# Patient Record
Sex: Male | Born: 1985 | Hispanic: Yes | Marital: Married | Smoking: Current every day smoker
Health system: Southern US, Community
[De-identification: ages and names within clinical notes are randomized; demographics above are authoritative.]

## PROBLEM LIST (undated history)

## (undated) DIAGNOSIS — F419 Anxiety disorder, unspecified: Secondary | ICD-10-CM

## (undated) DIAGNOSIS — E785 Hyperlipidemia, unspecified: Secondary | ICD-10-CM

## (undated) DIAGNOSIS — F32A Depression, unspecified: Secondary | ICD-10-CM

## (undated) DIAGNOSIS — Z8659 Personal history of other mental and behavioral disorders: Secondary | ICD-10-CM

## (undated) DIAGNOSIS — IMO0002 Reserved for concepts with insufficient information to code with codable children: Secondary | ICD-10-CM

## (undated) DIAGNOSIS — F1020 Alcohol dependence, uncomplicated: Secondary | ICD-10-CM

## (undated) DIAGNOSIS — F1910 Other psychoactive substance abuse, uncomplicated: Secondary | ICD-10-CM

## (undated) DIAGNOSIS — I10 Essential (primary) hypertension: Secondary | ICD-10-CM

## (undated) DIAGNOSIS — G894 Chronic pain syndrome: Secondary | ICD-10-CM

## (undated) DIAGNOSIS — F329 Major depressive disorder, single episode, unspecified: Secondary | ICD-10-CM

## (undated) HISTORY — DX: Alcohol dependence, uncomplicated: F10.20

## (undated) HISTORY — DX: Reserved for concepts with insufficient information to code with codable children: IMO0002

## (undated) HISTORY — PX: NECK SURGERY: SHX720

## (undated) HISTORY — DX: Major depressive disorder, single episode, unspecified: F32.9

## (undated) HISTORY — DX: Anxiety disorder, unspecified: F41.9

## (undated) HISTORY — PX: OTHER SURGICAL HISTORY: SHX169

## (undated) HISTORY — DX: Other psychoactive substance abuse, uncomplicated: F19.10

## (undated) HISTORY — DX: Chronic pain syndrome: G89.4

## (undated) HISTORY — DX: Personal history of other mental and behavioral disorders: Z86.59

## (undated) HISTORY — DX: Depression, unspecified: F32.A

## (undated) HISTORY — DX: Hyperlipidemia, unspecified: E78.5

---

## 2008-01-07 ENCOUNTER — Ambulatory Visit: Payer: Self-pay | Admitting: Occupational Medicine

## 2008-11-17 ENCOUNTER — Ambulatory Visit: Payer: Self-pay | Admitting: Family Medicine

## 2008-11-17 DIAGNOSIS — M545 Low back pain, unspecified: Secondary | ICD-10-CM | POA: Insufficient documentation

## 2008-11-17 DIAGNOSIS — F329 Major depressive disorder, single episode, unspecified: Secondary | ICD-10-CM | POA: Insufficient documentation

## 2008-11-27 ENCOUNTER — Encounter: Payer: Self-pay | Admitting: Family Medicine

## 2008-12-01 ENCOUNTER — Encounter: Payer: Self-pay | Admitting: Family Medicine

## 2008-12-06 ENCOUNTER — Encounter: Payer: Self-pay | Admitting: Family Medicine

## 2008-12-09 ENCOUNTER — Ambulatory Visit: Payer: Self-pay | Admitting: Family Medicine

## 2008-12-09 DIAGNOSIS — S92909A Unspecified fracture of unspecified foot, initial encounter for closed fracture: Secondary | ICD-10-CM | POA: Insufficient documentation

## 2008-12-15 ENCOUNTER — Ambulatory Visit: Payer: Self-pay | Admitting: Family Medicine

## 2008-12-15 DIAGNOSIS — R209 Unspecified disturbances of skin sensation: Secondary | ICD-10-CM | POA: Insufficient documentation

## 2008-12-16 ENCOUNTER — Telehealth: Payer: Self-pay | Admitting: Family Medicine

## 2008-12-16 ENCOUNTER — Encounter: Payer: Self-pay | Admitting: Family Medicine

## 2008-12-17 ENCOUNTER — Telehealth: Payer: Self-pay | Admitting: Family Medicine

## 2008-12-18 ENCOUNTER — Encounter: Payer: Self-pay | Admitting: Family Medicine

## 2008-12-23 ENCOUNTER — Encounter: Payer: Self-pay | Admitting: Family Medicine

## 2008-12-24 ENCOUNTER — Telehealth (INDEPENDENT_AMBULATORY_CARE_PROVIDER_SITE_OTHER): Payer: Self-pay | Admitting: *Deleted

## 2008-12-25 ENCOUNTER — Encounter: Payer: Self-pay | Admitting: Family Medicine

## 2009-01-05 ENCOUNTER — Ambulatory Visit: Payer: Self-pay | Admitting: Family Medicine

## 2009-01-06 ENCOUNTER — Ambulatory Visit: Payer: Self-pay | Admitting: Family Medicine

## 2009-01-06 DIAGNOSIS — R300 Dysuria: Secondary | ICD-10-CM | POA: Insufficient documentation

## 2009-01-06 LAB — CONVERTED CEMR LAB
Bilirubin Urine: NEGATIVE
Blood in Urine, dipstick: NEGATIVE
Glucose, Urine, Semiquant: NEGATIVE
Ketones, urine, test strip: NEGATIVE
Nitrite: NEGATIVE
Protein, U semiquant: NEGATIVE
Specific Gravity, Urine: 1.02
Urobilinogen, UA: 0.2
WBC Urine, dipstick: NEGATIVE
pH: 7.5

## 2009-01-07 LAB — CONVERTED CEMR LAB
ALT: 12 units/L (ref 0–53)
AST: 12 units/L (ref 0–37)
Albumin: 4.2 g/dL (ref 3.5–5.2)
Alkaline Phosphatase: 53 units/L (ref 39–117)
BUN: 11 mg/dL (ref 6–23)
CO2: 25 meq/L (ref 19–32)
Calcium: 9.1 mg/dL (ref 8.4–10.5)
Chloride: 104 meq/L (ref 96–112)
Creatinine, Ser: 0.85 mg/dL (ref 0.40–1.50)
Glucose, Bld: 89 mg/dL (ref 70–99)
Potassium: 4.3 meq/L (ref 3.5–5.3)
Sodium: 141 meq/L (ref 135–145)
Total Bilirubin: 0.4 mg/dL (ref 0.3–1.2)
Total Protein: 6.9 g/dL (ref 6.0–8.3)

## 2009-01-14 ENCOUNTER — Encounter: Payer: Self-pay | Admitting: Family Medicine

## 2009-01-14 DIAGNOSIS — M5137 Other intervertebral disc degeneration, lumbosacral region: Secondary | ICD-10-CM | POA: Insufficient documentation

## 2009-01-14 DIAGNOSIS — M5136 Other intervertebral disc degeneration, lumbar region: Secondary | ICD-10-CM | POA: Insufficient documentation

## 2009-01-14 DIAGNOSIS — M5134 Other intervertebral disc degeneration, thoracic region: Secondary | ICD-10-CM | POA: Insufficient documentation

## 2009-01-14 DIAGNOSIS — IMO0002 Reserved for concepts with insufficient information to code with codable children: Secondary | ICD-10-CM | POA: Insufficient documentation

## 2009-01-15 ENCOUNTER — Telehealth: Payer: Self-pay | Admitting: Family Medicine

## 2009-01-26 ENCOUNTER — Ambulatory Visit: Payer: Self-pay | Admitting: Occupational Medicine

## 2009-01-26 DIAGNOSIS — T22019A Burn of unspecified degree of unspecified forearm, initial encounter: Secondary | ICD-10-CM | POA: Insufficient documentation

## 2009-01-27 ENCOUNTER — Ambulatory Visit: Payer: Self-pay | Admitting: Family Medicine

## 2009-02-10 ENCOUNTER — Ambulatory Visit: Payer: Self-pay | Admitting: Family Medicine

## 2009-02-10 DIAGNOSIS — R030 Elevated blood-pressure reading, without diagnosis of hypertension: Secondary | ICD-10-CM | POA: Insufficient documentation

## 2009-02-11 ENCOUNTER — Telehealth: Payer: Self-pay | Admitting: Family Medicine

## 2009-02-12 ENCOUNTER — Telehealth: Payer: Self-pay | Admitting: Family Medicine

## 2009-03-02 ENCOUNTER — Telehealth: Payer: Self-pay | Admitting: Family Medicine

## 2009-03-12 ENCOUNTER — Ambulatory Visit: Payer: Self-pay | Admitting: Family Medicine

## 2009-03-12 DIAGNOSIS — G43119 Migraine with aura, intractable, without status migrainosus: Secondary | ICD-10-CM | POA: Insufficient documentation

## 2009-03-12 DIAGNOSIS — R112 Nausea with vomiting, unspecified: Secondary | ICD-10-CM | POA: Insufficient documentation

## 2009-03-17 ENCOUNTER — Ambulatory Visit: Payer: Self-pay | Admitting: Family Medicine

## 2009-03-18 ENCOUNTER — Encounter: Payer: Self-pay | Admitting: Family Medicine

## 2009-04-07 ENCOUNTER — Encounter: Payer: Self-pay | Admitting: Family Medicine

## 2009-04-09 ENCOUNTER — Encounter: Payer: Self-pay | Admitting: Family Medicine

## 2009-04-17 ENCOUNTER — Ambulatory Visit: Payer: Self-pay | Admitting: Family Medicine

## 2009-04-21 ENCOUNTER — Ambulatory Visit: Payer: Self-pay | Admitting: Family Medicine

## 2009-05-06 ENCOUNTER — Ambulatory Visit: Payer: Self-pay | Admitting: Family Medicine

## 2009-05-07 ENCOUNTER — Telehealth (INDEPENDENT_AMBULATORY_CARE_PROVIDER_SITE_OTHER): Payer: Self-pay | Admitting: *Deleted

## 2009-06-03 ENCOUNTER — Ambulatory Visit: Payer: Self-pay | Admitting: Family Medicine

## 2009-06-22 ENCOUNTER — Telehealth: Payer: Self-pay | Admitting: Family Medicine

## 2009-06-29 ENCOUNTER — Ambulatory Visit: Payer: Self-pay | Admitting: Family Medicine

## 2009-06-29 DIAGNOSIS — I498 Other specified cardiac arrhythmias: Secondary | ICD-10-CM | POA: Insufficient documentation

## 2009-07-02 LAB — CONVERTED CEMR LAB
Amphetamine Screen, Ur: NEGATIVE
Barbiturate Quant, Ur: NEGATIVE
Benzodiazepines.: NEGATIVE
Cocaine Metabolites: NEGATIVE
Creatinine,U: 207.9 mg/dL
Ethyl Alcohol: 75 mg/dL — ABNORMAL HIGH (ref <10)
Marijuana Metabolite: NEGATIVE
Methadone: NEGATIVE
Opiate Screen, Urine: NEGATIVE
Phencyclidine (PCP): NEGATIVE
Propoxyphene: NEGATIVE

## 2009-07-13 ENCOUNTER — Ambulatory Visit: Payer: Self-pay | Admitting: Family Medicine

## 2009-07-16 ENCOUNTER — Telehealth (INDEPENDENT_AMBULATORY_CARE_PROVIDER_SITE_OTHER): Payer: Self-pay | Admitting: *Deleted

## 2009-07-22 ENCOUNTER — Encounter: Payer: Self-pay | Admitting: Family Medicine

## 2009-07-27 ENCOUNTER — Ambulatory Visit: Payer: Self-pay | Admitting: Family Medicine

## 2009-07-27 DIAGNOSIS — R55 Syncope and collapse: Secondary | ICD-10-CM | POA: Insufficient documentation

## 2009-08-03 ENCOUNTER — Telehealth: Payer: Self-pay | Admitting: Family Medicine

## 2009-08-04 ENCOUNTER — Ambulatory Visit: Payer: Self-pay | Admitting: Family Medicine

## 2009-08-04 DIAGNOSIS — F10939 Alcohol use, unspecified with withdrawal, unspecified: Secondary | ICD-10-CM | POA: Insufficient documentation

## 2009-08-04 DIAGNOSIS — F10239 Alcohol dependence with withdrawal, unspecified: Secondary | ICD-10-CM | POA: Insufficient documentation

## 2009-08-11 ENCOUNTER — Telehealth: Payer: Self-pay | Admitting: Family Medicine

## 2009-08-11 ENCOUNTER — Encounter: Payer: Self-pay | Admitting: Family Medicine

## 2009-08-11 ENCOUNTER — Telehealth (INDEPENDENT_AMBULATORY_CARE_PROVIDER_SITE_OTHER): Payer: Self-pay | Admitting: *Deleted

## 2009-08-21 ENCOUNTER — Encounter: Payer: Self-pay | Admitting: Family Medicine

## 2009-08-25 ENCOUNTER — Ambulatory Visit: Payer: Self-pay | Admitting: Family Medicine

## 2009-08-25 DIAGNOSIS — F1021 Alcohol dependence, in remission: Secondary | ICD-10-CM | POA: Insufficient documentation

## 2009-08-25 DIAGNOSIS — F1921 Other psychoactive substance dependence, in remission: Secondary | ICD-10-CM | POA: Insufficient documentation

## 2009-08-28 ENCOUNTER — Encounter: Payer: Self-pay | Admitting: Family Medicine

## 2009-08-28 ENCOUNTER — Telehealth: Payer: Self-pay | Admitting: Family Medicine

## 2009-09-01 ENCOUNTER — Encounter: Payer: Self-pay | Admitting: Family Medicine

## 2009-09-02 ENCOUNTER — Encounter: Payer: Self-pay | Admitting: Family Medicine

## 2009-10-07 ENCOUNTER — Encounter: Payer: Self-pay | Admitting: Family Medicine

## 2009-10-09 ENCOUNTER — Ambulatory Visit: Payer: Self-pay | Admitting: Family Medicine

## 2009-10-14 ENCOUNTER — Ambulatory Visit: Payer: Self-pay | Admitting: Family Medicine

## 2010-05-04 NOTE — Progress Notes (Signed)
----   Converted from flag ---- ---- 05/06/2009 8:41 PM, [name] DO wrote: pls fax last OV note to Dr Jordan Likes, thanks ------------------------------  Faxed note to Dr. Jordan Likes. KJ LPN

## 2010-05-04 NOTE — Progress Notes (Signed)
Summary: Call A Nurse  Phone Note From Other Clinic   Summary of Call: [facility] Triage Call Report Triage Record Num: [id number] Operator: Sula Rumple Patient Name: Cole [name] Call Date & Time: 08/10/2009 9:52:39PM Patient Phone: [phone] PCP: [name], DO Patient Gender: Male PCP Fax : [phone] Patient DOB: 1986-01-14 Practice Name: [facility] Reason for Call: [name]/Wife calling on 08/09/09 states pt is on Ativan one week ago/pt was in hospital for app one week/d/c on 08/04/09/ pt has had increasing confusion/increased sleepiness/afebrile/pt fell several times last week and hit his head. Advised to go to ER Protocol(s) Used: Confusion / Disorientation / Agitation Recommended Outcome per Protocol: See ED Immediately Reason for Outcome: New or worsening confusion, disorientation, or agitation related to a possible reversible cause (infection, dehydration, fever, etc.) Care Advice:  ~  Initial call taken by: Payton Spark CMA,  Aug 11, 2009 8:10 AM     Appended Document: Call A Nurse Marcelino Duster, Pls call and see if pt is back in the hospital and let's see if we can expedite an outpt neurology appt for him.  [name], D.O.  Appended Document: Call A Nurse [facility] for Pt to CB.Arvilla Market CMA, Michelle Aug 11, 2009 11:36 AM   Pt is in hosp. Wife spoke w/ Dr. Cathey Endow.Arvilla Market CMA, Michelle Aug 11, 2009 4:52 PM

## 2010-05-04 NOTE — Letter (Signed)
Summary: [facility]  [facility]   Imported By: Lanelle Bal 09/07/2009 09:27:44  _____________________________________________________________________  External Attachment:    Type:   Image     Comment:   External Document
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
## 2010-05-04 NOTE — Progress Notes (Signed)
Summary: Flexeril refill  Phone Note Refill Request   Refills Requested: Medication #1:  CYCLOBENZAPRINE HCL 10 MG TABS 1 tab by mouth at bedtime as needed back pain Initial call taken by: Payton Spark CMA,  June 22, 2009 2:42 PM    Prescriptions: CYCLOBENZAPRINE HCL 10 MG TABS (CYCLOBENZAPRINE HCL) 1 tab by mouth at bedtime as needed back pain  #30 x 2   Entered and Authorized by:   [name] DO   Signed by:   [name] DO on 06/22/2009   Method used:   Electronically to        [facility]* (retail)       [address] [place], [place]  [address]       Ph: [phone]       Fax: [phone]   RxID:   [id number]

## 2010-05-04 NOTE — Progress Notes (Signed)
Summary: Call A Nurse  Phone Note Call from Patient   Summary of Call: Call-A-Nurse Triage Call Report Triage Record Num: [id number] Operator: Albertine Grates Patient Name: Cole [name] Call Date & Time: 08/02/2009 7:10:59PM Patient Phone: PCP: [name], DO Patient Gender: Male PCP Fax : [phone] Patient DOB: 11-21-1985 [facility] Reason for Call: [name] calling and states has fainted 6 times in last 3 hours. Was seen in ED 4-29 and diagnosed with possible seizure. Was prescribed Ativan. Has lost med 5-1. Has had headache since seizures began. Seizure will last for 2 minutes. Will have tremors when occurs. Advised 911. Protocol(s) Used: Seizure Recommended Outcome per Protocol: Activate EMS 911 Reason for Outcome: Headache (severe or unusual) Care Advice:  ~ Initial call taken by: Payton Spark CMA,  Aug 03, 2009 8:11 AM  Follow-up for Phone Call        Pls call his house and find out if he went back to the hospital. Follow-up by: [name] DO,  Aug 03, 2009 8:14 AM     Appended Document: Call A Nurse Called only number listed and [facility] for a RC

## 2010-05-04 NOTE — Progress Notes (Signed)
Summary: call ASAP to discuss pt care  Phone Note Call from Patient   Caller: Spouse Summary of Call: Wife called and states that he was taken by ambulance last night and was sent to [facility] because he over dosed on his meds. Pt's wife is very upset and needs to talk to Dr. Cathey Endow to let Dr.Novella Abraha know what is going on with Cole [name]. Call [phone]- wife's name is Cole [name], and would like for you to call her as soon as possible. Thanks, Michaelle Copas Initial call taken by: Michaelle Copas,  Aug 11, 2009 2:55 PM  Follow-up for Phone Call        I called her back and LMOM to call me back to discuss. Follow-up by: [name] DO,  Aug 11, 2009 3:36 PM  Additional Follow-up for Phone Call Additional follow up Details #1::        I spoke w/ Cole [name]' wife Hospital doctor and he is back in the hosp, at [facility].  He OD'd on ATivan last night and she reports that he has been taking too many ativan, morphine tabs and 'sucking on' his fentanyl patch.  She has yet to get him to cards or neuro b/c he continues to get readmitted.  She is still not sure if he is having true seizures.  She would like him admitted to [facility] for inpt assistance and I am in agreement with this plan. Additional Follow-up by: [name] DO,  Aug 11, 2009 5:02 PM
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
## 2010-05-04 NOTE — Letter (Signed)
Summary: [facility]  [facility]   Imported By: Lanelle Bal 07/28/2009 14:13:24  _____________________________________________________________________  External Attachment:    Type:   Image     Comment:   External Document
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
## 2010-05-04 NOTE — Letter (Signed)
Summary: Meds & Instructions/[facility]  Meds & Instructions/[facility]   Imported By: Lanelle Bal 08/19/2009 14:11:12  _____________________________________________________________________  External Attachment:    Type:   Image     Comment:   External Document

## 2010-05-04 NOTE — Progress Notes (Signed)
Summary: MRI brain -- normal  Phone Note Outgoing Call   Summary of Call: Kim- Pls call pt and let him know that his MRI of the brain came back completely normal.  Fax copy to [facility] for upcoming appt.  Thanks! Initial call taken by: [name] DO,  Aug 28, 2009 2:26 PM  Follow-up for Phone Call        pt notified of results. Copy faxed to Neurology Follow-up by: Kathlene November,  Aug 28, 2009 2:29 PM

## 2010-05-04 NOTE — Letter (Signed)
Summary: [facility]  [facility]   Imported By: Lanelle Bal 04/28/2009 10:59:35  _____________________________________________________________________  External Attachment:    Type:   Image     Comment:   External Document
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
## 2010-05-04 NOTE — Consult Note (Signed)
Summary: [facility] @ [facility] @ [place]   Imported By: Lanelle Bal 09/08/2009 11:34:58  _____________________________________________________________________  External Attachment:    Type:   Image     Comment:   External Document

## 2010-05-04 NOTE — Letter (Signed)
Summary: Depression Questionnaire/[name]  Depression Questionnaire/[place] [name]   Imported By: Lanelle Bal 06/09/2009 13:32:33  _____________________________________________________________________  External Attachment:    Type:   Image     Comment:   External Document

## 2010-05-04 NOTE — Letter (Signed)
Summary: [facility]  [facility]   Imported By: Lanelle Bal 10/20/2009 13:48:31  _____________________________________________________________________  External Attachment:    Type:   Image     Comment:   External Document
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
## 2010-05-04 NOTE — Progress Notes (Signed)
Summary: Call A Nurse  Phone Note From Other Clinic   Caller: Call A Nurse Summary of Call: [facility] Triage Call Report Triage Record Num: [id number] Operator: Remonia Richter Patient Name: Cole [name] Call Date & Time: 07/15/2009 9:29:45PM Patient Phone: [phone] PCP: [name], DO Patient Gender: Male PCP Fax : [phone] Patient DOB: 04/01/1986 Practice Name: [facility] Reason for Call: Ginger at [facility] [name] :calling to get Zofran script on this patient who says it was supposed to be called in by MD, Patient called and seen in office 2 days ago for nausea and told script not called in for Zofran as told to him by office for flu like s/s, Zofran 4mg  PO q 8 hrs as needed,#6,NR called in, advised to contact office for further refill. Protocol(s) Used: Medication Question Calls, No Triage (Adults) Recommended Outcome per Protocol: Provide Information or Advice Only Reason for Outcome: Caller requesting a refill, no triage required and triager able to refill per unit policy Care Advice:  ~ 04/13/ Initial call taken by: Payton Spark CMA,  July 16, 2009 8:19 AM  Follow-up for Phone Call        Pls call pt back today and let him know that I got his message but I did no  just see him in the office for nausea.  Not sure what office he went to but the info that he gave call a nurse was inaccurate.   Follow-up by: [name] DO,  July 16, 2009 8:23 AM  Additional Follow-up for Phone Call Additional follow up Details #1::        LMOM for Pt to CB. Additional Follow-up by: Payton Spark CMA,  July 16, 2009 8:29 AM

## 2010-05-04 NOTE — Letter (Signed)
Summary: CONSENT FOR MEDICAL RECORDS  CONSENT FOR MEDICAL RECORDS   Imported By: Shelbie Proctor 09/02/2009 18:26:56  _____________________________________________________________________  External Attachment:    Type:   Image     Comment:   External Document

## 2010-05-04 NOTE — Letter (Signed)
Summary: [facility]  [facility]   Imported By: Lanelle Bal 07/30/2009 10:50:16  _____________________________________________________________________  External Attachment:    Type:   Image     Comment:   External Document

## 2010-05-04 NOTE — Letter (Signed)
Summary: [facility]  [facility]   Imported By: Lanelle Bal 04/16/2009 12:03:31  _____________________________________________________________________  External Attachment:    Type:   Image     Comment:   External Document
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
## 2010-05-04 NOTE — Letter (Signed)
Summary: Depression Questionnaire/[place] Kath[facility]ne Sharper  Depression Questionnaire/[place] [facility] Sharper   Imported By: Lanelle Bal 05/12/2009 11:12:12  _____________________________________________________________________  External Attachment:    Type:   Image     Comment:   External Document

## 2010-06-14 ENCOUNTER — Ambulatory Visit (INDEPENDENT_AMBULATORY_CARE_PROVIDER_SITE_OTHER): Admitting: Psychology

## 2010-06-14 DIAGNOSIS — F4325 Adjustment disorder with mixed disturbance of emotions and conduct: Secondary | ICD-10-CM

## 2010-06-25 ENCOUNTER — Encounter (INDEPENDENT_AMBULATORY_CARE_PROVIDER_SITE_OTHER): Admitting: Psychology

## 2010-06-25 DIAGNOSIS — F4325 Adjustment disorder with mixed disturbance of emotions and conduct: Secondary | ICD-10-CM

## 2010-07-12 ENCOUNTER — Encounter (HOSPITAL_COMMUNITY): Admitting: Psychology

## 2010-07-13 ENCOUNTER — Encounter (HOSPITAL_COMMUNITY): Admitting: Psychology

## 2010-07-19 ENCOUNTER — Encounter (HOSPITAL_COMMUNITY): Admitting: Psychology

## 2011-07-14 ENCOUNTER — Encounter: Admitting: Physician Assistant

## 2011-07-15 ENCOUNTER — Encounter: Payer: Self-pay | Admitting: *Deleted

## 2011-07-18 ENCOUNTER — Ambulatory Visit (INDEPENDENT_AMBULATORY_CARE_PROVIDER_SITE_OTHER): Admitting: Physician Assistant

## 2011-07-18 ENCOUNTER — Encounter: Payer: Self-pay | Admitting: Physician Assistant

## 2011-07-18 VITALS — BP 150/92 | HR 88 | Ht 73.0 in | Wt 232.0 lb

## 2011-07-18 DIAGNOSIS — Z13220 Encounter for screening for lipoid disorders: Secondary | ICD-10-CM

## 2011-07-18 DIAGNOSIS — IMO0001 Reserved for inherently not codable concepts without codable children: Secondary | ICD-10-CM

## 2011-07-18 DIAGNOSIS — Z889 Allergy status to unspecified drugs, medicaments and biological substances status: Secondary | ICD-10-CM

## 2011-07-18 DIAGNOSIS — M545 Low back pain, unspecified: Secondary | ICD-10-CM

## 2011-07-18 DIAGNOSIS — Z0000 Encounter for general adult medical examination without abnormal findings: Secondary | ICD-10-CM

## 2011-07-18 NOTE — Progress Notes (Signed)
Subjective:    Patient ID: Cole [name], male    DOB: [birth year], 26 y.o.   MRN: [id number]  HPI Patient presents to the clinic for CPE. Patient has a history of low back pain since he was injured in the [employer]. He is taking bottles of ibuprofen and tylenol monthly. He reports that at one point he was addicted to pain medication(opoids). He really wants some additional help with pain management so he doesn't have to take so many OTC pills.   He does not have a history of high bp but patients bp is elevated today. He has been adding a lot of salt to his foods and his wife cooks with a lot of salt. He drinks approximately 12 beers a week. He reports that he doesn't eat out very much. He does exercise 3-4 days a week with cardio.  He also has been having episode in which he breaks out in a rash on the upper part of his body. He has not changed his detergent and he can not think of any common denominator. He does not do anything and it just goes away. He denies any SOB, wheezing, or feeling like his throat is going to close up.It can happen when he is just sitting there or when he is up and about.    Review of Systems     Objective:   Physical Exam  Constitutional: He is oriented to person, place, and time. He appears well-developed and well-nourished.  HENT:  Head: Normocephalic and atraumatic.  Right Ear: External ear normal.  Left Ear: External ear normal.  Nose: Nose normal.  Mouth/Throat: Oropharynx is clear and moist. No oropharyngeal exudate.  Eyes: Conjunctivae and EOM are normal. Pupils are equal, round, and reactive to light.  Neck: Normal range of motion. Neck supple.  Cardiovascular: Normal rate, regular rhythm, normal heart sounds and intact distal pulses.   Pulmonary/Chest: Effort normal and breath sounds normal. He has no wheezes.  Abdominal: Soft. Bowel sounds are normal. He exhibits no distension and no mass. There is no tenderness. There is no rebound and no guarding.    Genitourinary:       Not done.  Musculoskeletal: Normal range of motion.       Mild tenderness over the thoraciclumbar spine with palpation. Strength 5/5.  Lymphadenopathy:    He has no cervical adenopathy.  Neurological: He is alert and oriented to person, place, and time. He has normal reflexes. No cranial nerve deficit.  Skin: Skin is warm and dry.  Psychiatric: He has a normal mood and affect. His behavior is normal.          Assessment & Plan:  CPE- Gave lab form to get blood work after fasting for 8 hours. Will call with results. Vaccines are up to date. Patient was instructed to continue to exercise 4-5 days a week. Discussed significant weight gain. Talked about making sure patient was maintaing a heathy diet and limited bad carbs. Encouraged patient to decrease beer intake along with fried foods and red meat. Encouraged patient to take multivitamin daily.  Low back pain, chronic- Patient wants to be referred to pain management that will not use opioids with him only epidural injections/PT or other pain control methods. Currently consuming too many OTC tylenol and Ibuprofen.  Elevated blood pressure- Discussed that we need to recheck blood pressure in 1 month with a nurse visit. Patient needs to decrease sodium intake not adding any salt to foods, decreasing the amount of processed food  you consume, limiting eating out, and decrease beer intake to no more than 2 a day. Reminded patient that weight loss can also help bring bp down also.  History of allergic reaction- Keep a diary of when rash appears and the things you are doing when they happen. Always keep benadryl handy to take when rash occurs. If your throat ever starts to swell or breathing becomes a problem take benadryl 50mg  and call office.

## 2011-07-18 NOTE — Patient Instructions (Addendum)
Continue exercise regularly. Recheck one year. If still having allergic reactions and want to be tested can call office for referral. Will refer to pain management for back pain for non-opoid treatment. Come in for nurse visit in 1 month to recheck blood pressure. In the meantime, decrease sodium intake.

## 2011-08-25 ENCOUNTER — Telehealth: Payer: Self-pay | Admitting: *Deleted

## 2011-08-25 DIAGNOSIS — M545 Low back pain: Secondary | ICD-10-CM

## 2011-08-25 DIAGNOSIS — G8929 Other chronic pain: Secondary | ICD-10-CM

## 2011-08-25 NOTE — Telephone Encounter (Signed)
Pt is requesting referral to [facility] in [place].

## 2011-08-26 NOTE — Telephone Encounter (Signed)
treid to call and inform pt but states number is invalid

## 2011-08-26 NOTE — Telephone Encounter (Signed)
Call and let know sent referral. If not called in a week please call office to make sure everything has been scheduled.

## 2011-10-12 ENCOUNTER — Telehealth: Payer: Self-pay | Admitting: *Deleted

## 2011-10-12 MED ORDER — PERMETHRIN 5 % EX CREA: TOPICAL_CREAM | Freq: Once | CUTANEOUS | Status: AC

## 2011-10-12 NOTE — Telephone Encounter (Signed)
Sent to pharmacy 

## 2011-10-12 NOTE — Telephone Encounter (Signed)
Pt states his kids had scabies 2-4 weeks ago and now he has the same thing and request something be called in.

## 2011-10-13 NOTE — Telephone Encounter (Signed)
Pt informed

## 2012-05-11 ENCOUNTER — Other Ambulatory Visit: Payer: Self-pay | Admitting: Physician Assistant

## 2012-05-11 MED ORDER — CYPROHEPTADINE HCL 4 MG PO TABS: 4.0000 mg | ORAL_TABLET | Freq: Three times a day (TID) | ORAL | Status: DC | PRN

## 2012-05-11 MED ORDER — CETIRIZINE HCL 10 MG PO TABS: 10.0000 mg | ORAL_TABLET | Freq: Every day | ORAL | Status: DC

## 2012-05-11 MED ORDER — LORATADINE 10 MG PO TABS: 10.0000 mg | ORAL_TABLET | Freq: Every day | ORAL | Status: DC

## 2012-10-10 ENCOUNTER — Ambulatory Visit (INDEPENDENT_AMBULATORY_CARE_PROVIDER_SITE_OTHER): Admitting: Physician Assistant

## 2012-10-10 ENCOUNTER — Encounter: Payer: Self-pay | Admitting: Physician Assistant

## 2012-10-10 VITALS — BP 144/94 | HR 90 | Wt 240.0 lb

## 2012-10-10 DIAGNOSIS — F909 Attention-deficit hyperactivity disorder, unspecified type: Secondary | ICD-10-CM

## 2012-10-10 MED ORDER — AMPHETAMINE-DEXTROAMPHET ER 20 MG PO CP24: 20.0000 mg | ORAL_CAPSULE | ORAL | Status: DC

## 2012-10-10 NOTE — Progress Notes (Signed)
  Subjective:    Patient ID: Cole [name], male    DOB: 04/07/1985, 27 y.o.   MRN: [id number]  HPI Patient presents to the clinic to address lack of focus and ADHD symptoms. He had been diagnosed as a child but stopped medication thinking he could control on his on. He comes in today but he feels like his focus is worsening. He is very easily distracted. He is having problems staying on task. At work he makes mistakes. 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 He starts many task but cannot seem to finish. He denies any anxiety or depression. He is effected most at work. As a child he was on concerta and ritalin. He denies any CP, palpations, SOB.    Review of Systems     Objective:   Physical Exam  Constitutional: He is oriented to person, place, and time. He appears well-developed and well-nourished.  HENT:  Head: Normocephalic and atraumatic.  Cardiovascular: Normal rate, regular rhythm and normal heart sounds.   Pulmonary/Chest: Effort normal and breath sounds normal.  Neurological: He is alert and oriented to person, place, and time.  Skin: Skin is warm and dry.  Psychiatric:  Easily distracted. Found it difficult to finish questionaire. Stop and started different train of thoughts throughout encounter.           Assessment & Plan:  ADHD- ADD screening 18/21. ADHD questionaire positive for attention problems and hyperactivity scoring above cut off. Due to HPI and history of ADHD treatment I did put on adderall daily. Discussed control nautrue of drug. SE of insomnia, heart racing, BP issues. Discussed to keep in a safe place. Will follow up in 1 month. Encouraged exercise to help with symptoms.

## 2012-10-11 DIAGNOSIS — F909 Attention-deficit hyperactivity disorder, unspecified type: Secondary | ICD-10-CM | POA: Insufficient documentation

## 2012-10-29 ENCOUNTER — Encounter: Payer: Self-pay | Admitting: Sports Medicine

## 2012-10-29 ENCOUNTER — Ambulatory Visit (INDEPENDENT_AMBULATORY_CARE_PROVIDER_SITE_OTHER): Admitting: Sports Medicine

## 2012-10-29 VITALS — BP 147/102 | HR 127 | Wt 225.0 lb

## 2012-10-29 DIAGNOSIS — M545 Low back pain, unspecified: Secondary | ICD-10-CM

## 2012-10-29 DIAGNOSIS — F1121 Opioid dependence, in remission: Secondary | ICD-10-CM | POA: Insufficient documentation

## 2012-10-29 MED ORDER — PREDNISONE 10 MG PO TABS: ORAL_TABLET | ORAL | Status: DC

## 2012-10-29 MED ORDER — KETOROLAC TROMETHAMINE 30 MG/ML IM SOLN: 30.0000 mg | Freq: Once | INTRAMUSCULAR | Status: DC

## 2012-10-29 NOTE — Assessment & Plan Note (Signed)
I would avoid narcotics in this individual.

## 2012-10-29 NOTE — Assessment & Plan Note (Addendum)
This sounds discogenic with right-sided L4 radiculitis. It sounds as though he's had multiple MRIs, I do not have access to any of them. He's already had oral steroids, physical therapy, no surgeries. He has had epidurals in the past that were effective. I do need a new MRI to further assess his anatomy as it's likely changed since his last MRI several years ago, I'd like him to followup with me for interventional injection planning. Toradol 30 mg intramuscular today. Prednisone taper.
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
## 2012-10-30 ENCOUNTER — Ambulatory Visit (HOSPITAL_BASED_OUTPATIENT_CLINIC_OR_DEPARTMENT_OTHER)

## 2012-11-07 ENCOUNTER — Telehealth: Payer: Self-pay | Admitting: *Deleted

## 2012-11-07 ENCOUNTER — Ambulatory Visit (INDEPENDENT_AMBULATORY_CARE_PROVIDER_SITE_OTHER): Admitting: Physician Assistant

## 2012-11-07 ENCOUNTER — Encounter: Payer: Self-pay | Admitting: Physician Assistant

## 2012-11-07 VITALS — BP 142/84 | HR 109 | Wt 228.0 lb

## 2012-11-07 DIAGNOSIS — M545 Low back pain, unspecified: Secondary | ICD-10-CM

## 2012-11-07 DIAGNOSIS — N5089 Other specified disorders of the male genital organs: Secondary | ICD-10-CM

## 2012-11-07 DIAGNOSIS — F909 Attention-deficit hyperactivity disorder, unspecified type: Secondary | ICD-10-CM

## 2012-11-07 DIAGNOSIS — F1921 Other psychoactive substance dependence, in remission: Secondary | ICD-10-CM

## 2012-11-07 MED ORDER — AMPHETAMINE-DEXTROAMPHET ER 20 MG PO CP24: 20.0000 mg | ORAL_CAPSULE | ORAL | Status: DC

## 2012-11-07 NOTE — Patient Instructions (Addendum)
Will order MRI for [facility].   Will call urologist and let you know what he thinks should be done.   Will refer to pain management.   Refilled Adderall will recheck in one month.

## 2012-11-07 NOTE — Telephone Encounter (Signed)
No prior auth needed for MRI L spine w/i contrast

## 2012-11-07 NOTE — Progress Notes (Signed)
  Subjective:    Patient ID: Cole [name], male    DOB: [birth year], 27 y.o.   MRN: [id number]  HPI Patient is a 27 year old male who presents to the clinic to followup on one month of Adderall for ADHD. Patient is doing well on therapy. He feels much more focused at work and able to complete tasks. He would rate his improvement about 65%. He denies any insomnia, anorexia, palpitations. His blood pressure has increased since last visit before starting Adderall. He denies any headaches, vision changes, shortness of breath. He is benefiting from therapy would like to continue.  He was seen by Dr. Bonnita Levan on 10/29/12 for ongoing low back pain. An MRI was set up for for the [facility]. He has to pay for [facility] but does not have the [facility] system he would like a referral put in for another MRI for [facility]. He is also concerned that he has been labeled with a history of narcotic abuse at this office and not been getting any pain medication. He would like a referral to pain management. He reports that he did at one time of her dose on OxyContin but was many years ago and has since used narcotics as needed for back pain. He is aware of how that looks but feels like with his chronic issues still may need narcotic therapy at some point.  Patient also has had some unusual episodes in the last week or so. He reports that every time he has orgasm his testicles raised to above his penis and tighten. He describes it is almost feels like his testicles have gone back up into his body. He denies it being painful but says it is uncomfortable. He does feel like once his testicles come down after orgasm they are tender and swollen. He has done nothing to make better. Nothing seems to make worse. He has never had anything like this before.    Review of Systems     Objective:   Physical Exam  Constitutional: He is oriented to person, place, and time. He appears well-developed and well-nourished.   HENT:  Head: Normocephalic and atraumatic.  Cardiovascular: Normal rate, regular rhythm and normal heart sounds.   Pulmonary/Chest: Effort normal and breath sounds normal.  Abdominal: Soft. Bowel sounds are normal.  Neurological: He is alert and oriented to person, place, and time.  Skin: Skin is warm and dry.  Psychiatric: He has a normal mood and affect. His behavior is normal.          Assessment & Plan:  ADHD-we'll keep patient has same dose of Adderall. Blood pressure was slightly increased but recheck today and had gone back down. We'll recheck in one month blood pressure mixture patient sold doing good at dose.  Chronic low back pain-will refer to pain management. Will order MRI for [facility].  Episode testicular swelling and raising-not exactly sure of what could be causing this. I did talk to patient about testicular torsion and how the mechanism of this happening makes that more likely. His testicles do not come down after orgasm or begin to swell please call office or go to emergency room. I will talk to other colleagues about protocol and potentially call urology to see if referral is needed.

## 2012-11-09 ENCOUNTER — Telehealth: Payer: Self-pay | Admitting: Physician Assistant

## 2012-11-09 DIAGNOSIS — N5089 Other specified disorders of the male genital organs: Secondary | ICD-10-CM

## 2012-11-09 DIAGNOSIS — N50819 Testicular pain, unspecified: Secondary | ICD-10-CM

## 2012-11-09 NOTE — Telephone Encounter (Signed)
Call pt: Let him know I consulted a Doctor in office and they were also not aware of the mechanism of action of this. I would like for you to be seen by urology. Will make referral.

## 2012-12-05 ENCOUNTER — Ambulatory Visit: Admitting: Physician Assistant

## 2012-12-07 ENCOUNTER — Ambulatory Visit (INDEPENDENT_AMBULATORY_CARE_PROVIDER_SITE_OTHER): Admitting: Physician Assistant

## 2012-12-07 ENCOUNTER — Encounter: Payer: Self-pay | Admitting: Physician Assistant

## 2012-12-07 VITALS — BP 139/85 | HR 93 | Wt 217.0 lb

## 2012-12-07 DIAGNOSIS — M545 Low back pain, unspecified: Secondary | ICD-10-CM

## 2012-12-07 DIAGNOSIS — IMO0002 Reserved for concepts with insufficient information to code with codable children: Secondary | ICD-10-CM

## 2012-12-07 DIAGNOSIS — F909 Attention-deficit hyperactivity disorder, unspecified type: Secondary | ICD-10-CM

## 2012-12-07 MED ORDER — AMPHETAMINE-DEXTROAMPHET ER 20 MG PO CP24: 20.0000 mg | ORAL_CAPSULE | ORAL | Status: DC

## 2012-12-07 NOTE — Patient Instructions (Addendum)
Faxed referral and ov notes and ins to [facility] at [facility] [phone] and their Phone is [phone]. Requested they schedule with patient for a good appt time. Thanks  Consider looking into [facility].

## 2012-12-08 NOTE — Progress Notes (Signed)
  Subjective:    Patient ID: Cole [name], male    DOB: May 21, 1985, 27 y.o.   MRN: [id number]  HPI Pt presents to the clinic to recheck BP. Pt has been on ADHD medication and want to make sure BP not elevating. Pt denies any CP, palpitations, HA's, vision changes. He feels great on current dose. Pt is losing weight by not drinking soft drinks and trying to stay more active.   He has never been called with pain management appt.    Review of Systems     Objective:   Physical Exam  Constitutional: He is oriented to person, place, and time. He appears well-developed and well-nourished.  HENT:  Head: Normocephalic and atraumatic.  Cardiovascular: Normal rate, regular rhythm and normal heart sounds.   Pulmonary/Chest: Effort normal and breath sounds normal.  Neurological: He is alert and oriented to person, place, and time.  Skin: Skin is warm and dry.  Psychiatric: He has a normal mood and affect. His behavior is normal.          Assessment & Plan:  ADHD/elevated BP- Looking back over history there has been no change in BP with addition of Adderall. Refilled Adderall today. Will keep a close monitor on BP. Pt does not want to start a medication for BP. He is losing weight and I hope that will decrease BP just a tad. Pt's BP is certanily only slightly elevated. Follow up in 3 months.   Sent another referral for pain management.

## 2013-01-07 ENCOUNTER — Other Ambulatory Visit: Payer: Self-pay | Admitting: Physician Assistant

## 2013-01-07 ENCOUNTER — Other Ambulatory Visit: Payer: Self-pay | Admitting: *Deleted

## 2013-01-07 MED ORDER — ADDERALL 20 MG PO TABS: 20.0000 mg | ORAL_TABLET | Freq: Every day | ORAL | Status: DC

## 2013-01-07 MED ORDER — AMPHETAMINE-DEXTROAMPHET ER 20 MG PO CP24: 20.0000 mg | ORAL_CAPSULE | ORAL | Status: DC

## 2013-01-07 MED ORDER — ADDERALL XR 20 MG PO CP24: 20.0000 mg | ORAL_CAPSULE | ORAL | Status: DC

## 2013-01-07 NOTE — Progress Notes (Signed)
Pt needed XR.

## 2013-02-04 ENCOUNTER — Other Ambulatory Visit: Payer: Self-pay | Admitting: *Deleted

## 2013-02-04 MED ORDER — ADDERALL XR 20 MG PO CP24: 20.0000 mg | ORAL_CAPSULE | ORAL | Status: DC

## 2013-02-06 ENCOUNTER — Encounter: Payer: Self-pay | Admitting: Physician Assistant

## 2013-02-06 ENCOUNTER — Ambulatory Visit (INDEPENDENT_AMBULATORY_CARE_PROVIDER_SITE_OTHER): Admitting: Physician Assistant

## 2013-02-06 VITALS — BP 126/80 | HR 98 | Wt 210.0 lb

## 2013-02-06 DIAGNOSIS — F909 Attention-deficit hyperactivity disorder, unspecified type: Secondary | ICD-10-CM

## 2013-02-06 MED ORDER — ADDERALL XR 25 MG PO CP24: 25.0000 mg | ORAL_CAPSULE | ORAL | Status: DC

## 2013-02-06 NOTE — Progress Notes (Signed)
  Subjective:    Patient ID: Cole [name], male    DOB: December 13, 1985, 27 y.o.   MRN: [id number]  HPI Patient presents to the clinic to followup on ADHD and Adderall. He is found that the brand only Adderall works better than the generic. He feels that the 20 mg dose it is not lasting him as far into the evening as he would like. He works a 12 hour shift. He finds that the Adderall is wearing off around the 4:30 5:00 mark. He denies any insomnia or palpitations. He does have some decreased appetite but is not losing weight and is eating 3 meals a day.   Review of Systems     Objective:   Physical Exam  Constitutional: He is oriented to person, place, and time. He appears well-developed and well-nourished.  Cardiovascular: Normal rate, regular rhythm and normal heart sounds.   Pulmonary/Chest: Effort normal and breath sounds normal.  Neurological: He is alert and oriented to person, place, and time.  Skin: Skin is warm and dry.  Psychiatric: He has a normal mood and affect. His behavior is normal.          Assessment & Plan:  ADHD-increased extended-release Adderall to 25 mg. What may now within the month if this is controlling symptoms. Once we get an appropriate dose and then start following up less. Followup in 3 months.

## 2013-03-07 ENCOUNTER — Telehealth: Payer: Self-pay | Admitting: *Deleted

## 2013-03-07 NOTE — Telephone Encounter (Signed)
Pt states that you mentioned he could get his adderall increased to 30mg . Please advise and if so we can call him to pick it up on Friday.  Meyer Cory, LPN

## 2013-03-08 ENCOUNTER — Other Ambulatory Visit: Payer: Self-pay | Admitting: Physician Assistant

## 2013-03-08 ENCOUNTER — Ambulatory Visit: Admitting: Physician Assistant

## 2013-03-08 MED ORDER — ADDERALL XR 30 MG PO CP24: 30.0000 mg | ORAL_CAPSULE | Freq: Every day | ORAL | Status: DC

## 2013-03-08 NOTE — Telephone Encounter (Signed)
Will increase extended release to 30mg  for one month.

## 2013-03-08 NOTE — Telephone Encounter (Signed)
Pt picked up rx

## 2013-04-05 ENCOUNTER — Other Ambulatory Visit: Payer: Self-pay | Admitting: *Deleted

## 2013-04-05 MED ORDER — ADDERALL XR 30 MG PO CP24: 30.0000 mg | ORAL_CAPSULE | Freq: Every day | ORAL | Status: DC

## 2013-05-08 ENCOUNTER — Other Ambulatory Visit: Payer: Self-pay | Admitting: *Deleted

## 2013-05-08 MED ORDER — ADDERALL XR 30 MG PO CP24: 30.0000 mg | ORAL_CAPSULE | Freq: Every day | ORAL | Status: DC

## 2013-05-22 ENCOUNTER — Encounter: Payer: Self-pay | Admitting: Physician Assistant

## 2013-05-22 ENCOUNTER — Ambulatory Visit (INDEPENDENT_AMBULATORY_CARE_PROVIDER_SITE_OTHER): Admitting: Physician Assistant

## 2013-05-22 VITALS — BP 114/102 | HR 113 | Wt 209.0 lb

## 2013-05-22 DIAGNOSIS — M545 Low back pain, unspecified: Secondary | ICD-10-CM

## 2013-05-22 DIAGNOSIS — R000 Tachycardia, unspecified: Secondary | ICD-10-CM

## 2013-05-22 DIAGNOSIS — I498 Other specified cardiac arrhythmias: Secondary | ICD-10-CM

## 2013-05-22 DIAGNOSIS — R030 Elevated blood-pressure reading, without diagnosis of hypertension: Secondary | ICD-10-CM

## 2013-05-22 MED ORDER — PREDNISONE 50 MG PO TABS: ORAL_TABLET | ORAL | Status: DC

## 2013-05-22 MED ORDER — CYCLOBENZAPRINE HCL 10 MG PO TABS: 10.0000 mg | ORAL_TABLET | Freq: Three times a day (TID) | ORAL | Status: DC | PRN

## 2013-05-22 MED ORDER — MELOXICAM 15 MG PO TABS: 15.0000 mg | ORAL_TABLET | Freq: Every day | ORAL | Status: DC

## 2013-05-22 MED ORDER — HYDROCODONE-ACETAMINOPHEN 10-325 MG PO TABS: 1.0000 | ORAL_TABLET | Freq: Three times a day (TID) | ORAL | Status: DC | PRN

## 2013-05-22 MED ORDER — KETOROLAC TROMETHAMINE 60 MG/2ML IM SOLN
60.0000 mg | Freq: Once | INTRAMUSCULAR | Status: AC
  Administered 2013-05-22: 60 mg via INTRAMUSCULAR

## 2013-05-22 NOTE — Patient Instructions (Addendum)
Will give Mobic daily.  Shot of Toradol 60mg .  Prednisone burst.  norco as needed up to every 8 hours for pain.

## 2013-05-22 NOTE — Progress Notes (Signed)
   Subjective:    Patient ID: Cole [name], male    DOB: 06/02/1985, 28 y.o.   MRN: [id number]  HPI Pt presents to the clinic with his wife in acute low back pain. Pt has hx of chronic low back pain but per pt pain is 10/10 currently. Pt denies any new trauma. He did buy a bed about a week ago and he thought would give back more support. He woke up and drove to work and over a 5 minute period he started experiencing severe back pain. Worse on lower right side down leg and into outer toes. Worse sitting/walking/bending/movment. Best with standing. Pain radiates and burns down right leg. Denies any bowel or bladder dysfunction. Denies any saddle anesthia. Pt has no range of motion. Pt eating ibuprofen but does not help. In the past he has tried epidural injections/PT and also not helped. He has been living in pain for many years.  He is playing phone tag with pain clinic and doesn't want to just be prescribed pain rx. 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 Per pt his wife controls rx and makes sure he only takes in response to pain.      Review of Systems     Objective:   Physical Exam  Constitutional: He is oriented to person, place, and time. He appears well-developed and well-nourished.  HENT:  Head: Normocephalic and atraumatic.  Cardiovascular: Regular rhythm and normal heart sounds.   Tachycardia at 113.   Pulmonary/Chest: Effort normal and breath sounds normal.  Musculoskeletal:  Wears back brace for support. Cannot sit during visit. ROM is limited side to side and with flexion and extension approximately 20 degrees of ROM today until pain starts. Pain over lumbar spine to palpation. Positive straight leg test on the right. Strength 4/5 right leg. 5/5 left leg. Patellar reflexes 2+ symmetric.   Neurological: He is alert and oriented to person, place, and time.  Psychiatric: He has a normal mood and affect. His behavior is normal.          Assessment & Plan:   Chronic/acute low back pain- pt needs MRI will schedule. Pt needs to discuss ongoing back changes with orthopedist, will refer. Today will give toradol 60mg  injection, flexeril as needed, mobic 15mg  daily, prednisone burst.  Norco given for severe breakthrough pain. Discussed with pt's wife that I am not going to continue to prescribe pain rx. Once get referred to ortho they will prescribe until they decide if anything surgical can be done. If not then he must go to pain management. Pt's wife is in control of all of narcotics. She is aware of husband addiction in past but also aware of ongoing pain today.   Sinus tachycardia/elevated diastolic BP- likely due to pain will recheck in 1 month.

## 2013-06-07 ENCOUNTER — Other Ambulatory Visit: Payer: Self-pay | Admitting: *Deleted

## 2013-06-07 MED ORDER — ADDERALL XR 30 MG PO CP24: 30.0000 mg | ORAL_CAPSULE | Freq: Every day | ORAL | Status: DC

## 2013-06-19 ENCOUNTER — Encounter: Payer: Self-pay | Admitting: Physician Assistant

## 2013-06-26 ENCOUNTER — Ambulatory Visit: Admitting: Physician Assistant

## 2013-06-28 ENCOUNTER — Ambulatory Visit (INDEPENDENT_AMBULATORY_CARE_PROVIDER_SITE_OTHER): Admitting: Physician Assistant

## 2013-06-28 ENCOUNTER — Encounter: Payer: Self-pay | Admitting: Physician Assistant

## 2013-06-28 ENCOUNTER — Encounter (INDEPENDENT_AMBULATORY_CARE_PROVIDER_SITE_OTHER): Payer: Self-pay

## 2013-06-28 VITALS — BP 147/81 | HR 101 | Ht 73.0 in | Wt 210.0 lb

## 2013-06-28 DIAGNOSIS — F909 Attention-deficit hyperactivity disorder, unspecified type: Secondary | ICD-10-CM

## 2013-06-28 DIAGNOSIS — M545 Low back pain, unspecified: Secondary | ICD-10-CM

## 2013-06-28 DIAGNOSIS — G8929 Other chronic pain: Secondary | ICD-10-CM

## 2013-06-28 MED ORDER — MELOXICAM 15 MG PO TABS: 15.0000 mg | ORAL_TABLET | Freq: Every day | ORAL | Status: DC

## 2013-06-28 MED ORDER — ADDERALL XR 30 MG PO CP24: 30.0000 mg | ORAL_CAPSULE | Freq: Every day | ORAL | Status: DC

## 2013-06-28 MED ORDER — HYDROCODONE-ACETAMINOPHEN 10-325 MG PO TABS: 1.0000 | ORAL_TABLET | Freq: Three times a day (TID) | ORAL | Status: DC | PRN

## 2013-06-30 NOTE — Progress Notes (Signed)
   Subjective:    Patient ID: Cole [name], male    DOB: 04/11/1985, 28 y.o.   MRN: [id number]  HPI Pt is a 28 yo male who presents to hte clinic to get medication refills. Pt is doing well on adderall. He denies any heart palpitations, CP, insomnia, weight loss. He finds himself much more focused and able to accomplish tasks.   Pt also request one more refill on norco. He [facility]oped[facility] and stated that surgery was not an option at this point. They referred him to Dr. Beverely PaceBryant pain management to discuss other options for pain control. He sees them next Wednesday. He continues to use mobic daily as well as norco/flexeril as needed. He continues to work which puts him in a lot of pain. Pain today is 4/10 but can get to 9/10.    Review of Systems     Objective:   Physical Exam  Constitutional: He is oriented to person, place, and time. He appears well-developed and well-nourished.  HENT:  Head: Normocephalic and atraumatic.  Cardiovascular: Normal rate, regular rhythm and normal heart sounds.   Pulmonary/Chest: Effort normal and breath sounds normal.  Neurological: He is alert and oriented to person, place, and time.  Skin: Skin is dry.  Psychiatric: He has a normal mood and affect. His behavior is normal.          Assessment & Plan:  Low back pain, chronic/DDD- I did refill norco 30 tabs today. Discussed that from now on pain management with take over pain control.   ADHD- refilled adderall for 3 months.

## 2013-07-11 ENCOUNTER — Encounter: Payer: Self-pay | Admitting: Family Medicine

## 2013-09-25 ENCOUNTER — Encounter: Payer: Self-pay | Admitting: Physician Assistant

## 2013-09-25 ENCOUNTER — Ambulatory Visit (INDEPENDENT_AMBULATORY_CARE_PROVIDER_SITE_OTHER): Admitting: Physician Assistant

## 2013-09-25 VITALS — BP 114/71 | HR 101 | Ht 73.0 in | Wt 196.0 lb

## 2013-09-25 DIAGNOSIS — N50812 Left testicular pain: Secondary | ICD-10-CM

## 2013-09-25 DIAGNOSIS — F909 Attention-deficit hyperactivity disorder, unspecified type: Secondary | ICD-10-CM

## 2013-09-25 DIAGNOSIS — N509 Disorder of male genital organs, unspecified: Secondary | ICD-10-CM

## 2013-09-25 DIAGNOSIS — F900 Attention-deficit hyperactivity disorder, predominantly inattentive type: Secondary | ICD-10-CM | POA: Insufficient documentation

## 2013-09-25 MED ORDER — ADDERALL XR 30 MG PO CP24: 30.0000 mg | ORAL_CAPSULE | Freq: Every day | ORAL | Status: DC

## 2013-09-25 NOTE — Progress Notes (Signed)
   Subjective:    Patient ID: Cole [name], male    DOB: 09/11/1985, 28 y.o.   MRN: [id number]  HPI Pt presents to the clinic to follow up on ADHD. Pt doing well with medication. No problems or concerns.   He continues to have testicular problems. originally there was some pain and sensation of testicles only with orgasm. Now seems to be happening more often and on left side. Continues to happen more with intercourse. Pain last for 15 seconds and then spontaneously resolves. No urinary symptoms.   Review of Systems  All other systems reviewed and are negative.      Objective:   Physical Exam  Constitutional: He is oriented to person, place, and time. He appears well-developed and well-nourished.  HENT:  Head: Normocephalic and atraumatic.  Cardiovascular: Normal rate, regular rhythm and normal heart sounds.   Pulmonary/Chest: Effort normal and breath sounds normal.  Neurological: He is alert and oriented to person, place, and time.  Psychiatric: He has a normal mood and affect. His behavior is normal.          Assessment & Plan:  ADHD- refilled adderall for 3 months.   Left testicular pain- unclear etiology.  pt declined exam today. Will refer to urologist.

## 2014-01-22 ENCOUNTER — Ambulatory Visit: Admitting: Physician Assistant

## 2014-01-24 ENCOUNTER — Ambulatory Visit (INDEPENDENT_AMBULATORY_CARE_PROVIDER_SITE_OTHER): Admitting: Physician Assistant

## 2014-01-24 ENCOUNTER — Encounter: Payer: Self-pay | Admitting: Physician Assistant

## 2014-01-24 VITALS — BP 116/76 | HR 94 | Ht 73.0 in | Wt 199.0 lb

## 2014-01-24 DIAGNOSIS — F900 Attention-deficit hyperactivity disorder, predominantly inattentive type: Secondary | ICD-10-CM

## 2014-01-24 MED ORDER — ADDERALL XR 30 MG PO CP24: 30.0000 mg | ORAL_CAPSULE | Freq: Every day | ORAL | Status: DC

## 2014-01-24 NOTE — Progress Notes (Signed)
   Subjective:    Patient ID: Derrel NipChristopher Daniele, male    DOB: 10/08/1985, 28 y.o.   MRN: [id number]  HPI  Pt presents to the clinic for ADHD follow up and refill.  No problems or concerns.  Denies any side effects. No anxiety, insomnia, or palpitations.   Review of Systems  All other systems reviewed and are negative.      Objective:   Physical Exam  Constitutional: He is oriented to person, place, and time. He appears well-developed and well-nourished.  HENT:  Head: Normocephalic and atraumatic.  Cardiovascular: Normal rate, regular rhythm and normal heart sounds.   Pulmonary/Chest: Effort normal and breath sounds normal.  Neurological: He is alert and oriented to person, place, and time.  Skin: Skin is dry.  Psychiatric: He has a normal mood and affect. His behavior is normal.          Assessment & Plan:  ADHD- refilled adderall for 3 months.   Pt declined flu shot.

## 2014-05-09 ENCOUNTER — Encounter: Payer: Self-pay | Admitting: Physician Assistant

## 2014-05-09 ENCOUNTER — Ambulatory Visit (INDEPENDENT_AMBULATORY_CARE_PROVIDER_SITE_OTHER): Admitting: Physician Assistant

## 2014-05-09 VITALS — BP 120/76 | HR 96 | Ht 73.0 in | Wt 190.0 lb

## 2014-05-09 DIAGNOSIS — M5442 Lumbago with sciatica, left side: Secondary | ICD-10-CM

## 2014-05-09 DIAGNOSIS — M5441 Lumbago with sciatica, right side: Secondary | ICD-10-CM

## 2014-05-09 DIAGNOSIS — F900 Attention-deficit hyperactivity disorder, predominantly inattentive type: Secondary | ICD-10-CM

## 2014-05-09 MED ORDER — ADDERALL XR 30 MG PO CP24: 30.0000 mg | ORAL_CAPSULE | Freq: Every day | ORAL | Status: DC

## 2014-05-09 NOTE — Progress Notes (Signed)
   Subjective:    Patient ID: Cole [name], male    DOB: 12/14/1985, 29 y.o.   MRN: [id number]  HPI Pt presents to the clinic to follow up on ADHD. Doing well. Does not take daily only when needs to be able to focus. No problems or concerns. No insomnia. He is working out and trying to eat better.   Chronic back pain- managed by spine clinic with norco and other treatment. Doing well. No problems.    Review of Systems  All other systems reviewed and are negative.      Objective:   Physical Exam  Constitutional: He is oriented to person, place, and time. He appears well-developed and well-nourished.  HENT:  Head: Normocephalic and atraumatic.  Cardiovascular: Normal rate, regular rhythm and normal heart sounds.   Pulmonary/Chest: Effort normal and breath sounds normal.  Neurological: He is alert and oriented to person, place, and time.  Skin: Skin is dry.  Psychiatric: He has a normal mood and affect. His behavior is normal.          Assessment & Plan:  ADHD- vital great. Doing well. Refilled for 3 months.   Chronic back pain- managed by spine clinic. Doing well.   Discussed need for CPE and labs this year.

## 2014-07-18 ENCOUNTER — Encounter: Payer: Self-pay | Admitting: Physician Assistant

## 2014-07-18 ENCOUNTER — Ambulatory Visit (INDEPENDENT_AMBULATORY_CARE_PROVIDER_SITE_OTHER)

## 2014-07-18 ENCOUNTER — Ambulatory Visit (INDEPENDENT_AMBULATORY_CARE_PROVIDER_SITE_OTHER): Admitting: Physician Assistant

## 2014-07-18 VITALS — BP 137/88 | HR 98 | Wt 182.0 lb

## 2014-07-18 DIAGNOSIS — M25572 Pain in left ankle and joints of left foot: Secondary | ICD-10-CM

## 2014-07-18 DIAGNOSIS — B353 Tinea pedis: Secondary | ICD-10-CM | POA: Diagnosis not present

## 2014-07-18 DIAGNOSIS — S99922A Unspecified injury of left foot, initial encounter: Secondary | ICD-10-CM

## 2014-07-18 DIAGNOSIS — S99929A Unspecified injury of unspecified foot, initial encounter: Secondary | ICD-10-CM | POA: Insufficient documentation

## 2014-07-18 MED ORDER — TERBINAFINE HCL 1 % EX CREA: TOPICAL_CREAM | CUTANEOUS | Status: DC

## 2014-07-18 NOTE — Patient Instructions (Addendum)
Toe Fracture Your caregiver has diagnosed you as having a fractured toe. A toe fracture is a break in the bone of a toe. "Buddy taping" is a way of splinting your broken toe, by taping the broken toe to the toe next to it. This "buddy taping" will keep the injured toe from moving beyond normal range of motion. Buddy taping also helps the toe heal in a more normal alignment. It may take 6 to 8 weeks for the toe injury to heal. HOME CARE INSTRUCTIONS   Leave your toes taped together for as long as directed by your caregiver or until you see a doctor for a follow-up examination. You can change the tape after bathing. Always use a small piece of gauze or cotton between the toes when taping them together. This will help the skin stay dry and prevent infection.  Apply ice to the injury for 15-20 minutes each hour while awake for the first 2 days. Put the ice in a plastic bag and place a towel between the bag of ice and your skin.  After the first 2 days, apply heat to the injured area. Use heat for the next 2 to 3 days. Place a heating pad on the foot or soak the foot in warm water as directed by your caregiver.  Keep your foot elevated as much as possible to lessen swelling.  Wear sturdy, supportive shoes. The shoes should not pinch the toes or fit tightly against the toes.  Your caregiver may prescribe a rigid shoe if your foot is very swollen.  Your may be given crutches if the pain is too great and it hurts too much to walk.  Only take over-the-counter or prescription medicines for pain, discomfort, or fever as directed by your caregiver.  If your caregiver has given you a follow-up appointment, it is very important to keep that appointment. Not keeping the appointment could result in a chronic or permanent injury, pain, and disability. If there is any problem keeping the appointment, you must call back to this facility for assistance. SEEK MEDICAL CARE IF:   You have increased pain or swelling,  not relieved with medications.  The pain does not get better after 1 week.  Your injured toe is cold when the others are warm. SEEK IMMEDIATE MEDICAL CARE IF:   The toe becomes cold, numb, or white.  The toe becomes hot (inflamed) and red. Document Released: 03/18/2000 Document Revised: 06/13/2011 Document Reviewed: 11/05/2007 [facility] Patient Information 2015 [employer] [place]. This information is not intended to replace advice given to you by your health care provider. Make sure you discuss any questions you have with your health care provider.  Athlete's Foot Athlete's foot (tinea pedis) is a fungal infection of the skin on the feet. It often occurs on the skin between the toes or underneath the toes. It can also occur on the soles of the feet. Athlete's foot is more likely to occur in hot, humid weather. Not washing your feet or changing your socks often enough can contribute to athlete's foot. The infection can spread from person to person (contagious). CAUSES Athlete's foot is caused by a fungus. This fungus thrives in warm, moist places. Most people get athlete's foot by sharing shower stalls, towels, and wet floors with an infected person. People with weakened immune systems, including those with diabetes, may be more likely to get athlete's foot. SYMPTOMS   Itchy areas between the toes or on the soles of the feet.  White, flaky, or  scaly areas between the toes or on the soles of the feet.  Tiny, intensely itchy blisters between the toes or on the soles of the feet.  Tiny cuts on the skin. These cuts can develop a bacterial infection.  Thick or discolored toenails. DIAGNOSIS  Your caregiver can usually tell what the problem is by doing a physical exam. Your caregiver may also take a skin sample from the rash area. The skin sample may be examined under a microscope, or it may be tested to see if fungus will grow in the sample. A sample may also be taken from your toenail for  testing. TREATMENT  Over-the-counter and prescription medicines can be used to kill the fungus. These medicines are available as powders or creams. Your caregiver can suggest medicines for you. Fungal infections respond slowly to treatment. You may need to continue using your medicine for several weeks. PREVENTION   Do not share towels.  Wear sandals in wet areas, such as shared locker rooms and shared showers.  Keep your feet dry. Wear shoes that allow air to circulate. Wear cotton or wool socks. HOME CARE INSTRUCTIONS   Take medicines as directed by your caregiver. Do not use steroid creams on athlete's foot.  Keep your feet clean and cool. Wash your feet daily and dry them thoroughly, especially between your toes.  Change your socks every day. Wear cotton or wool socks. In hot climates, you may need to change your socks 2 to 3 times per day.  Wear sandals or canvas tennis shoes with good air circulation.  If you have blisters, soak your feet in Burow's solution or Epsom salts for 20 to 30 minutes, 2 times a day to dry out the blisters. Make sure you dry your feet thoroughly afterward. SEEK MEDICAL CARE IF:   You have a fever.  You have swelling, soreness, warmth, or redness in your foot.  You are not getting better after 7 days of treatment.  You are not completely cured after 30 days.  You have any problems caused by your medicines. MAKE SURE YOU:   Understand these instructions.  Will watch your condition.  Will get help right away if you are not doing well or get worse. Document Released: 03/18/2000 Document Revised: 06/13/2011 Document Reviewed: 01/07/2011 [facility] Patient Information 2015 [place], [place]. This information is not intended to replace advice given to you by your health care provider. Make sure you discuss any questions you have with your health care provider.

## 2014-07-18 NOTE — Progress Notes (Signed)
   Subjective:    Patient ID: Cole [name], male    DOB: 04/15/1985, [age] y.o.   MRN: [id number]  HPI  3 days ago pt stubbed 5th toe on a coffee table leg. It did pull toenail back and bleed. He presents today because still painful to walk on and wants to make sure not broke any where else. He has norco for pain. Not tried anything else.he worked yesterday and walked on it all day and made worse. Not tried any NSAIDs.     Review of Systems  All other systems reviewed and are negative.      Objective:   Physical Exam  Musculoskeletal:  Left 5th metatarsal brusied and swollen at tip. Toenail has been pulled up and dried blood underneath. Some pain to palpation down 5th metatarsal into left foot. No swelling of this area.   Skin:  Dry scaly bilateral feet          Assessment & Plan:  Toe injury- I think most of pain is from soft tissue swelling. Xray to confirm no avulsion fracture or jones fracture. No fracture seen on xray. Buddy taped today. Wear supportive shoes. Ice regularly. Ibuprofen 800mg  up to three times a day for inflammation. Written out of work for today. Return to work on Monday. norco to continue.   Tinea pedis- sent lamisil cream to pharmacy. HO given for prevention

## 2014-08-04 ENCOUNTER — Ambulatory Visit (INDEPENDENT_AMBULATORY_CARE_PROVIDER_SITE_OTHER): Admitting: Physician Assistant

## 2014-08-04 ENCOUNTER — Encounter: Payer: Self-pay | Admitting: Physician Assistant

## 2014-08-04 VITALS — BP 128/78 | HR 76 | Wt 188.0 lb

## 2014-08-04 DIAGNOSIS — F900 Attention-deficit hyperactivity disorder, predominantly inattentive type: Secondary | ICD-10-CM | POA: Diagnosis not present

## 2014-08-04 MED ORDER — ADDERALL XR 30 MG PO CP24: 30.0000 mg | ORAL_CAPSULE | Freq: Every day | ORAL | Status: DC

## 2014-08-04 MED ORDER — ADDERALL 10 MG PO TABS: ORAL_TABLET | ORAL | Status: DC

## 2014-08-04 MED ORDER — ADDERALL XR 30 MG PO CP24
30.0000 mg | ORAL_CAPSULE | Freq: Every day | ORAL | Status: DC
Start: 2014-08-04 — End: 2014-08-04

## 2014-08-04 MED ORDER — AMPHETAMINE-DEXTROAMPHETAMINE 10 MG PO TABS: ORAL_TABLET | ORAL | Status: DC

## 2014-08-05 NOTE — Progress Notes (Signed)
   Subjective:    Patient ID: Cole [name], male    DOB: 01/31/1986, 29 y.o.   MRN: [id number]  HPI Pt presents to the clinic for 3 month follow up for ADHD. Denies any side effects, insomnia, anxiety, palpitations. He does feel like some days wears off around 2-3pm. He takes dose around 6:30am. No other problems.    Review of Systems  All other systems reviewed and are negative.      Objective:   Physical Exam  Constitutional: He is oriented to person, place, and time. He appears well-developed and well-nourished.  HENT:  Head: Normocephalic and atraumatic.  Cardiovascular: Normal rate, regular rhythm and normal heart sounds.   Pulmonary/Chest: Effort normal and breath sounds normal.  Neurological: He is alert and oriented to person, place, and time.  Skin: Skin is dry.  Psychiatric: He has a normal mood and affect. His behavior is normal.          Assessment & Plan:  ADHD- refilled for 3 months extended release same dose. I did add an as needed afternoon immediate release of 10mg . I did not give enough for 3 months. Advise just take on evening where needs to be able to stay focused.

## 2014-12-18 ENCOUNTER — Encounter: Payer: Self-pay | Admitting: Family Medicine

## 2014-12-18 ENCOUNTER — Ambulatory Visit (INDEPENDENT_AMBULATORY_CARE_PROVIDER_SITE_OTHER): Admitting: Family Medicine

## 2014-12-18 VITALS — BP 147/95 | HR 124 | Ht 73.0 in | Wt 176.0 lb

## 2014-12-18 DIAGNOSIS — R000 Tachycardia, unspecified: Secondary | ICD-10-CM

## 2014-12-18 NOTE — Patient Instructions (Signed)
Thank you for coming in today. Follow up with [facility] tomorrow.  Call or go to the emergency room if you get worse, have trouble breathing, have chest pains, or palpitations.   Nonspecific Tachycardia Tachycardia is a faster than normal heartbeat (more than 100 beats per minute). In adults, the heart normally beats between 60 and 100 times a minute. A fast heartbeat may be a normal response to exercise or stress. It does not necessarily mean that something is wrong. However, sometimes when your heart beats too fast it may not be able to pump enough blood to the rest of your body. This can result in chest pain, shortness of breath, dizziness, and even fainting. Nonspecific tachycardia means that the specific cause or pattern of your tachycardia is unknown. CAUSES  Tachycardia may be harmless or it may be due to a more serious underlying cause. Possible causes of tachycardia include:  Exercise or exertion.  Fever.  Pain or injury.  Infection.  Loss of body fluids (dehydration).  Overactive thyroid.  Lack of red blood cells (anemia).  Anxiety and stress.  Alcohol.  Caffeine.  Tobacco products.  Diet pills.  Illegal drugs.  Heart disease. SYMPTOMS  Rapid or irregular heartbeat (palpitations).  Suddenly feeling your heart beating (cardiac awareness).  Dizziness.  Tiredness (fatigue).  Shortness of breath.  Chest pain.  Nausea.  Fainting. DIAGNOSIS  Your caregiver will perform a physical exam and take your medical history. In some cases, a heart specialist (cardiologist) may be consulted. Your caregiver may also order:  Blood tests.  Electrocardiography. This test records the electrical activity of your heart.  A heart monitoring test. TREATMENT  Treatment will depend on the likely cause of your tachycardia. The goal is to treat the underlying cause of your tachycardia. Treatment methods may include:  Replacement of fluids or blood through an intravenous (IV)  tube for moderate to severe dehydration or anemia.  New medicines or changes in your current medicines.  Diet and lifestyle changes.  Treatment for certain infections.  Stress relief or relaxation methods. HOME CARE INSTRUCTIONS   Rest.  Drink enough fluids to keep your urine clear or pale yellow.  Do not smoke.  Avoid:  Caffeine.  Tobacco.  Alcohol.  Chocolate.  Stimulants such as over-the-counter diet pills or pills that help you stay awake.  Situations that cause anxiety or stress.  Illegal drugs such as marijuana, phencyclidine (PCP), and cocaine.  Only take medicine as directed by your caregiver.  Keep all follow-up appointments as directed by your caregiver. SEEK IMMEDIATE MEDICAL CARE IF:   You have pain in your chest, upper arms, jaw, or neck.  You become weak, dizzy, or feel faint.  You have palpitations that will not go away.  You vomit, have diarrhea, or pass blood in your stool.  Your skin is cool, pale, and wet.  You have a fever that will not go away with rest, fluids, and medicine. MAKE SURE YOU:   Understand these instructions.  Will watch your condition.  Will get help right away if you are not doing well or get worse. Document Released: 04/28/2004 Document Revised: 06/13/2011 Document Reviewed: 03/01/2011 [facility] Patient Information 2015 [place], [place]. This information is not intended to replace advice given to you by your health care provider. Make sure you discuss any questions you have with your health care provider.
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
## 2014-12-18 NOTE — Assessment & Plan Note (Signed)
Likely related to drug withdrawal. Follow-up with PCP tomorrow. Doubtful for PE or other serious etiology based on symptoms and exam.

## 2014-12-19 ENCOUNTER — Ambulatory Visit (INDEPENDENT_AMBULATORY_CARE_PROVIDER_SITE_OTHER): Admitting: Physician Assistant

## 2014-12-19 ENCOUNTER — Ambulatory Visit: Admitting: Physician Assistant

## 2014-12-19 ENCOUNTER — Encounter: Payer: Self-pay | Admitting: Physician Assistant

## 2014-12-19 VITALS — BP 124/89 | HR 110 | Ht 73.0 in | Wt 176.0 lb

## 2014-12-19 DIAGNOSIS — IMO0001 Reserved for inherently not codable concepts without codable children: Secondary | ICD-10-CM

## 2014-12-19 DIAGNOSIS — R000 Tachycardia, unspecified: Secondary | ICD-10-CM

## 2014-12-19 DIAGNOSIS — R030 Elevated blood-pressure reading, without diagnosis of hypertension: Secondary | ICD-10-CM | POA: Diagnosis not present

## 2014-12-19 DIAGNOSIS — F900 Attention-deficit hyperactivity disorder, predominantly inattentive type: Secondary | ICD-10-CM | POA: Diagnosis not present

## 2014-12-19 MED ORDER — ADDERALL XR 30 MG PO CP24: 30.0000 mg | ORAL_CAPSULE | Freq: Every day | ORAL | Status: DC

## 2014-12-19 NOTE — Progress Notes (Deleted)
   Subjective:    Patient ID: Cole [name], male    DOB: [birth year], 29 y.o.   MRN: [id number]  HPI  3 weeks.   Review of Systems     Objective:   Physical Exam        Assessment & Plan:

## 2014-12-19 NOTE — Progress Notes (Addendum)
   Subjective:    Patient ID: Cole [name], male    DOB: 01/20/1986, 29 y.o.   MRN: [id number]  HPI Patient presents to clinic today for follow up on medication refill. He was seen in clinic yesterday for anxiety with tachycardia. EKG indicated normal sinus rhythm. He reports that he feels better today, but is still experiencing anxiety. He states he has been out of his norco for two days, and requests a refill. He regularly receives this medication from Dr. Park Liter at [facility] pain clinic. He states she is on maternity leave, and the office is not returning his calls. He also requests a refill on his Adderall. He has been out of adderall for 3 weeks.     Review of Systems Positive for symptoms listed in HPI    Objective:   Physical Exam   Cardiac: Tachycardic, no M/G/R Pulm: CTAB Mood: pt did not seem anxious today.       Assessment & Plan:  ADHD- Refill adderall XR mg 30 tablets qd. Only for one month. I want to recheck HR and BP after restarting pain medicine.   Tachycardia- [facility] was contacted regarding his chronic pain management. They confirmed Dr. Beverely Pace is on maternity leave and stated that he has an appointment on September 26th. It was not clear if prescribing norco today would violate his pain contract, so medication was not refilled. Patient encouraged to contact pain management clinic. i do suspect that pts HR and BP is due to withdrawal symptoms from not having norco as prescribed three times a day.   Reviewed and addendum made by Tandy Gaw PA-C

## 2015-01-06 ENCOUNTER — Encounter: Payer: Self-pay | Admitting: Physician Assistant

## 2015-01-06 ENCOUNTER — Ambulatory Visit (INDEPENDENT_AMBULATORY_CARE_PROVIDER_SITE_OTHER): Admitting: Physician Assistant

## 2015-01-06 VITALS — BP 132/80 | HR 117 | Ht 73.0 in | Wt 177.0 lb

## 2015-01-06 DIAGNOSIS — R000 Tachycardia, unspecified: Secondary | ICD-10-CM | POA: Diagnosis not present

## 2015-01-06 DIAGNOSIS — Z79899 Other long term (current) drug therapy: Secondary | ICD-10-CM

## 2015-01-06 MED ORDER — METOPROLOL SUCCINATE ER 25 MG PO TB24: 25.0000 mg | ORAL_TABLET | Freq: Every day | ORAL | Status: DC

## 2015-01-06 NOTE — Progress Notes (Signed)
   Subjective:    Patient ID: Cole [name], male    DOB: [birth year], 29 y.o.   MRN: [id number]  HPI Pt presents to the clinic for HR recheck. At last visit his HR was elevated. Suspected due to withdrawal from ongoing narcotics given to him by pain clinic. He is back on pain meds today. Denies any CP, SOB, headaches. He does have occasional heart palpitation. No dizziness. Doing well on adderall per pt.    Review of Systems  All other systems reviewed and are negative.      Objective:   Physical Exam  Constitutional: He is oriented to person, place, and time. He appears well-developed and well-nourished.  HENT:  Head: Normocephalic and atraumatic.  Cardiovascular: Regular rhythm and normal heart sounds.   No murmur heard. Tachycardia at 117.   Pulmonary/Chest: Effort normal and breath sounds normal.  Neurological: He is alert and oriented to person, place, and time.  Skin: Skin is dry.  Psychiatric: He has a normal mood and affect. His behavior is normal.          Assessment & Plan:  Tachycardia-we'll check tyroid, CMP, CBC, and ferritin to look for any other metabolic causes for tachycardia. Gave handout on things that could be increasing his heart rate such as caffeine. Will hold refill of stimulant at this time. Added metoprolol 25 mg daily and will recheck heart rate and blood pressure in 2 weeks. At that time we can decide if we should restart stimulant and keep a close watch out for increase of heart rate. Patient had an EKG approximately one month ago and showed no acute changes. Patient is asymptomatic.

## 2015-01-06 NOTE — Patient Instructions (Signed)
Nonspecific Tachycardia  Tachycardia is a faster than normal heartbeat (more than 100 beats per minute). In adults, the heart normally beats between 60 and 100 times a minute. A fast heartbeat may be a normal response to exercise or stress. It does not necessarily mean that something is wrong. However, sometimes when your heart beats too fast it may not be able to pump enough blood to the rest of your body. This can result in chest pain, shortness of breath, dizziness, and even fainting. Nonspecific tachycardia means that the specific cause or pattern of your tachycardia is unknown.  CAUSES   Tachycardia may be harmless or it may be due to a more serious underlying cause. Possible causes of tachycardia include:  · Exercise or exertion.  · Fever.  · Pain or injury.  · Infection.  · Loss of body fluids (dehydration).  · Overactive thyroid.  · Lack of red blood cells (anemia).  · Anxiety and stress.  · Alcohol.  · Caffeine.  · Tobacco products.  · Diet pills.  · Illegal drugs.  · Heart disease.  SYMPTOMS  · Rapid or irregular heartbeat (palpitations).  · Suddenly feeling your heart beating (cardiac awareness).  · Dizziness.  · Tiredness (fatigue).  · Shortness of breath.  · Chest pain.  · Nausea.  · Fainting.  DIAGNOSIS   Your caregiver will perform a physical exam and take your medical history. In some cases, a heart specialist (cardiologist) may be consulted. Your caregiver may also order:  · Blood tests.  · Electrocardiography. This test records the electrical activity of your heart.  · A heart monitoring test.  TREATMENT   Treatment will depend on the likely cause of your tachycardia. The goal is to treat the underlying cause of your tachycardia. Treatment methods may include:  · Replacement of fluids or blood through an intravenous (IV) tube for moderate to severe dehydration or anemia.  · New medicines or changes in your current medicines.  · Diet and lifestyle changes.  · Treatment for certain  infections.  · Stress relief or relaxation methods.  HOME CARE INSTRUCTIONS   · Rest.  · Drink enough fluids to keep your urine clear or pale yellow.  · Do not smoke.  · Avoid:  ¨ Caffeine.  ¨ Tobacco.  ¨ Alcohol.  ¨ Chocolate.  ¨ Stimulants such as over-the-counter diet pills or pills that help you stay awake.  ¨ Situations that cause anxiety or stress.  ¨ Illegal drugs such as marijuana, phencyclidine (PCP), and cocaine.  · Only take medicine as directed by your caregiver.  · Keep all follow-up appointments as directed by your caregiver.  SEEK IMMEDIATE MEDICAL CARE IF:   · You have pain in your chest, upper arms, jaw, or neck.  · You become weak, dizzy, or feel faint.  · You have palpitations that will not go away.  · You vomit, have diarrhea, or pass blood in your stool.  · Your skin is cool, pale, and wet.  · You have a fever that will not go away with rest, fluids, and medicine.  MAKE SURE YOU:   · Understand these instructions.  · Will watch your condition.  · Will get help right away if you are not doing well or get worse.  Document Released: 04/28/2004 Document Revised: 06/13/2011 Document Reviewed: 03/01/2011  ExitCare® Patient Information ©2015 [employer]. This information is not intended to replace advice given to you by your health care provider. Make sure you discuss any questions   you have with your health care provider.

## 2015-01-07 ENCOUNTER — Other Ambulatory Visit: Payer: Self-pay | Admitting: *Deleted

## 2015-01-07 MED ORDER — CELECOXIB 200 MG PO CAPS: 200.0000 mg | ORAL_CAPSULE | Freq: Two times a day (BID) | ORAL | Status: DC

## 2015-01-07 MED ORDER — CYCLOBENZAPRINE HCL 10 MG PO TABS: 10.0000 mg | ORAL_TABLET | Freq: Three times a day (TID) | ORAL | Status: DC | PRN

## 2015-01-07 MED ORDER — AMITRIPTYLINE HCL 25 MG PO TABS: 25.0000 mg | ORAL_TABLET | Freq: Every day | ORAL | Status: DC

## 2015-01-20 ENCOUNTER — Ambulatory Visit

## 2015-07-20 ENCOUNTER — Encounter: Payer: Self-pay | Admitting: Physician Assistant

## 2015-07-20 ENCOUNTER — Ambulatory Visit (INDEPENDENT_AMBULATORY_CARE_PROVIDER_SITE_OTHER): Admitting: Physician Assistant

## 2015-07-20 VITALS — BP 162/98 | HR 97 | Ht 73.0 in | Wt 210.0 lb

## 2015-07-20 DIAGNOSIS — M5440 Lumbago with sciatica, unspecified side: Secondary | ICD-10-CM

## 2015-07-20 DIAGNOSIS — I10 Essential (primary) hypertension: Secondary | ICD-10-CM | POA: Diagnosis not present

## 2015-07-20 DIAGNOSIS — G8929 Other chronic pain: Secondary | ICD-10-CM | POA: Diagnosis not present

## 2015-07-20 DIAGNOSIS — F900 Attention-deficit hyperactivity disorder, predominantly inattentive type: Secondary | ICD-10-CM | POA: Diagnosis not present

## 2015-07-20 MED ORDER — METOPROLOL SUCCINATE ER 25 MG PO TB24: 25.0000 mg | ORAL_TABLET | Freq: Every day | ORAL | Status: DC

## 2015-07-20 MED ORDER — CYCLOBENZAPRINE HCL 10 MG PO TABS: 10.0000 mg | ORAL_TABLET | Freq: Three times a day (TID) | ORAL | Status: DC | PRN

## 2015-07-20 MED ORDER — HYDROCODONE-ACETAMINOPHEN 10-325 MG PO TABS: 1.0000 | ORAL_TABLET | Freq: Three times a day (TID) | ORAL | Status: DC | PRN

## 2015-07-20 NOTE — Progress Notes (Signed)
   Subjective:    Patient ID: Derrel NipChristopher Hackworth, male    DOB: 10/22/1985, 30 y.o.   MRN: [id number]  HPI Patient is a 30 year old male who presents to the clinic to get medications refilled. He has been out of his Adderall for at least 2 months. He would like to restart it. His [facility]. They recently released him because he did not have enough hydrocodone in his urine. He admitted to them he was not taking as directed because he did not need it. He then slowly tapering to twice a day instead of 3 times a day. They said that not enough was found in his urine and released him. He would like a new pain clinic. He is in chronic pain due to back pain low bilateral with radiation into bilateral legs. Pt has had xrays and MRI's.   He denies any chest pains, shortness of breath, dizziness or headache due to blood pressure elevation. He admits he has not taken his metoprolol. He states he thought it was only quantity for one month and he was done.   Review of Systems  All other systems reviewed and are negative.      Objective:   Physical Exam  Constitutional: He is oriented to person, place, and time. He appears well-developed and well-nourished.  Cardiovascular: Normal rate, regular rhythm and normal heart sounds.   Pulmonary/Chest: Effort normal and breath sounds normal.  Neurological: He is alert and oriented to person, place, and time.  Psychiatric: He has a normal mood and affect. His behavior is normal.          Assessment & Plan:  Hypertension-blood pressure is out of control. He has not been taking metoprolol. Will restart metoprolol daily. Recheck nurse visit in 4 weeks.  ADHD-I will not refill Adderall until blood pressure controlled. Will give prescription once blood pressure is controlled.  Chronic low back pain-May new referral for Burtis Junesarsha Garvin. I did give him a one-month quantity until he gets in with pain  clinic.

## 2015-07-21 ENCOUNTER — Other Ambulatory Visit: Payer: Self-pay | Admitting: Physician Assistant

## 2015-08-19 ENCOUNTER — Ambulatory Visit

## 2015-08-21 ENCOUNTER — Ambulatory Visit

## 2015-08-26 ENCOUNTER — Ambulatory Visit (INDEPENDENT_AMBULATORY_CARE_PROVIDER_SITE_OTHER): Admitting: Physician Assistant

## 2015-08-26 VITALS — BP 127/84 | HR 86 | Wt 214.0 lb

## 2015-08-26 DIAGNOSIS — I10 Essential (primary) hypertension: Secondary | ICD-10-CM

## 2015-08-26 NOTE — Progress Notes (Signed)
Patient is here for blood pressure after restarting Metoprolol. Patient reports no issues taking the medication and his BP was well controlled while in office today. No refill required at this time. Pt does request a refill on the Adderall now since his BP is controlled. Advised PCP would have to review and see if refill was appropriate. Pt also states he would like a referral for "therapy." Questioned what type of therapy he meant. Pt reports he would have to "ask his wife" because it was something he "promised her he would do." Advised Pt to let us know and to keep his follow ups with PCP as recommended. Verbalized understanding.

## 2016-12-26 ENCOUNTER — Ambulatory Visit (INDEPENDENT_AMBULATORY_CARE_PROVIDER_SITE_OTHER): Admitting: Physician Assistant

## 2016-12-26 ENCOUNTER — Encounter: Payer: Self-pay | Admitting: Physician Assistant

## 2016-12-26 VITALS — BP 133/91 | HR 115 | Wt 203.0 lb

## 2016-12-26 DIAGNOSIS — Z131 Encounter for screening for diabetes mellitus: Secondary | ICD-10-CM | POA: Diagnosis not present

## 2016-12-26 DIAGNOSIS — Z13220 Encounter for screening for lipoid disorders: Secondary | ICD-10-CM | POA: Diagnosis not present

## 2016-12-26 DIAGNOSIS — F339 Major depressive disorder, recurrent, unspecified: Secondary | ICD-10-CM

## 2016-12-26 DIAGNOSIS — R000 Tachycardia, unspecified: Secondary | ICD-10-CM

## 2016-12-26 DIAGNOSIS — F411 Generalized anxiety disorder: Secondary | ICD-10-CM | POA: Diagnosis not present

## 2016-12-26 MED ORDER — BUPROPION HCL ER (XL) 150 MG PO TB24: 150.0000 mg | ORAL_TABLET | ORAL | 1 refills | Status: DC

## 2016-12-26 MED ORDER — METOPROLOL SUCCINATE ER 25 MG PO TB24: 25.0000 mg | ORAL_TABLET | Freq: Every day | ORAL | 1 refills | Status: DC

## 2016-12-26 NOTE — Progress Notes (Signed)
Subjective:    Patient ID: Cole [name], male    DOB: Feb 28, 1986, 31 y.o.   MRN: [id number]  HPI  Pt is a 31 yo male who presents to the clinic to discuss medications for mood. He is in counseling once a week and physiologist thinks he would benefit from medication. He is working full time and keeping down a job. He has no motivation and admits to feeling down a lot. He doesn't really ever want to leave house. He also admits to feeling worried about meaning different things and like he is not enough.  He denies any SI/HC. He has tried zoloft in the past and did not like the way he felt on it.   He does have chronic pain managed by pain clinic at [facility].   Pt is not taking metoprolol for HR or BP he felt like he did not need it. Denies any CP, palpitations.   .. Active Ambulatory Problems    Diagnosis Date Noted  . ALCOHOL WITHDRAWAL 08/04/2009  . DRUG ABUSE, IN REMISSION 08/25/2009  . DEPRESSIVE DISORDER NOT ELSEWHERE CLASSIFIED 11/17/2008  . MIGRAINE, CLASSICAL, INTRACTABLE 03/12/2009  . EYE PAIN, LEFT 04/17/2009  . SINUS TACHYCARDIA 06/29/2009  . 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 DISC DISEASE, LUMBAR 01/14/2009  . LOW BACK PAIN, CHRONIC 11/17/2008  . SYNCOPE 07/27/2009  . NUMBNESS 12/15/2008  . NAUSEA WITH VOMITING 03/12/2009  . DYSURIA 01/06/2009  . ELEVATED BLOOD PRESSURE WITHOUT DIAGNOSIS OF HYPERTENSION 02/10/2009  . FRACTURE, FOOT 12/09/2008  . ADHD (attention deficit hyperactivity disorder) 10/11/2012  . History of narcotic addiction (HCC) 10/29/2012  . Attention deficit hyperactivity disorder (ADHD), 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  . Toe injury 07/18/2014  . Tinea pedis of both feet 07/18/2014  . Tachycardia 12/18/2014  . Essential hypertension, benign 07/20/2015  . Depression, recurrent (HCC) 12/26/2016  . GAD (generalized anxiety disorder) 12/26/2016   Resolved Ambulatory Problems    Diagnosis Date Noted  . Burn of  unspecified degree of forearm 01/26/2009   Past Medical History:  Diagnosis Date  . Alcoholism (HCC)   . Anxiety   . Chronic pain syndrome   . DDD (degenerative disc disease)   . Depression   . Drug abuse   . History of ADHD   . Hyperlipidemia       Review of Systems  All other systems reviewed and are negative.      Objective:   Physical Exam  Constitutional: He is oriented to person, place, and time. He appears well-developed and well-nourished.  HENT:  Head: Normocephalic and atraumatic.  Cardiovascular: Normal rate, regular rhythm and normal heart sounds.   Pulmonary/Chest: Effort normal and breath sounds normal.  Neurological: He is alert and oriented to person, place, and time.  Psychiatric: His behavior is normal.  Flat affect.           Assessment & Plan:  Marland KitchenMarland KitchenHisao was seen today for discuss medication for mood.  Diagnoses and all orders for this visit:  GAD (generalized anxiety disorder) -     buPROPion (WELLBUTRIN XL) 150 MG 24 hr tablet; Take 1 tablet (150 mg total) by mouth every morning.  Depression, recurrent (HCC) -     buPROPion (WELLBUTRIN XL) 150 MG 24 hr tablet; Take 1 tablet (150 mg total) by mouth every morning.  Tachycardia -     TSH -     CBC -     metoprolol succinate (TOPROL-XL) 25 MG 24 hr tablet; Take 1 tablet (25 mg  total) by mouth daily.  Screening for lipid disorders -     Lipid Panel w/reflex Direct LDL  Screening for diabetes mellitus -     COMPLETE METABOLIC PANEL WITH GFR  Other orders -     Discontinue: buPROPion (WELLBUTRIN XL) 150 MG 24 hr tablet; Take 1 tablet (150 mg total) by mouth every morning. -     Discontinue: metoprolol succinate (TOPROL-XL) 25 MG 24 hr tablet; Take 1 tablet (25 mg total) by mouth daily.   .. Depression screen [facility] 2/9 12/26/2016  Decreased Interest 2  Down, Depressed, Hopeless 2  PHQ - 2 Score 4  Altered sleeping 0  Tired, decreased energy 2  Change in appetite 3  Feeling bad or  failure about yourself  3  Trouble concentrating 2  Moving slowly or fidgety/restless 1  Suicidal thoughts 0  PHQ-9 Score 15   .Marland Kitchen GAD 7 : Generalized Anxiety Score 12/26/2016  Nervous, Anxious, on Edge 3  Control/stop worrying 3  Worry too much - different things 3  Trouble relaxing 3  Restless 1  Easily annoyed or irritable 3  Afraid - awful might happen 1  Total GAD 7 Score 17    Started wellbutrin. Discussed side effects. Follow up in 4-6 weeks.  Continue with counseling.   Restart metroprolol for HR. Recheck in 4 weeks.   Marland Kitchen.Spent 30 minutes with patient and greater than 50 percent of visit spent counseling patient regarding treatment plan and ways to cope with depression and anxiety. Marland Kitchen

## 2017-01-20 ENCOUNTER — Encounter: Payer: Self-pay | Admitting: Family Medicine

## 2017-01-20 ENCOUNTER — Ambulatory Visit (INDEPENDENT_AMBULATORY_CARE_PROVIDER_SITE_OTHER): Admitting: Family Medicine

## 2017-01-20 VITALS — BP 144/84 | HR 81 | Wt 205.0 lb

## 2017-01-20 DIAGNOSIS — M25522 Pain in left elbow: Secondary | ICD-10-CM | POA: Diagnosis not present

## 2017-01-20 MED ORDER — GABAPENTIN 300 MG PO CAPS: ORAL_CAPSULE | ORAL | 3 refills | Status: DC

## 2017-01-20 MED ORDER — PREDNISONE 5 MG (48) PO TBPK: ORAL_TABLET | ORAL | 0 refills | Status: DC

## 2017-01-20 NOTE — Patient Instructions (Signed)
Thank you for coming in today. Take prednisone and gabapentin.  Recheck with me in 1-2 weeks.  If not better we will be getting MRI.  I think the shoulder pain is due to you trap spasm due to not being able to use your arm normally.  PT should help.   Cubital Tunnel Syndrome Cubital tunnel syndrome is a condition that causes pain and weakness of the forearm and hand. This condition happens when one of the nerves (ulnar nerve) that runs alongside the elbow joint becomes irritated. What are the causes? Causes of this condition include:  Increased pressure on the ulnar nerve at the elbow, arm, or forearm. This can be caused by: ? Swollen tissues. ? Ligaments. ? Muscles. ? Poorly healed elbow fractures. ? Tumors in the elbow. These are usually noncancerous (benign). ? Scar tissue that develops in the elbow after an injury. ? Bony growths (spurs) near the ulnar nerve.  Stretching of the nerve due to loose elbow ligaments.  Trauma to the nerve at the elbow.  Repetitive elbow bending.  Certain medical conditions.  What increases the risk? This condition is more likely to develop in:  People who do manual labor that requires frequently bending the elbow.  People who play sports that include repeated or strenuous throwing motions, such as baseball.  People who play contact sports, such as football or lacrosse.  People who do not warm up properly before activities.  People who have diabetes.  People who have an underactive thyroid (hypothyroidism).  What are the signs or symptoms? Symptoms of this condition include:  Clumsiness or weakness of the hand.  Tenderness of the inner elbow.  Aching or soreness of the inner elbow, forearm, or fingers, especially the little finger or the ring finger.  Increased pain with forced elbow bending.  Reduced control when throwing.  Tingling, numbness, or burning inside the forearm, or in part of the hand or fingers, especially the  little finger or the ring finger.  Sharp pains that shoot from the elbow down to the wrist and hand.  The inability to grip or pinch hard.  How is this diagnosed? This condition is diagnosed with a medical history and physical exam. Your health care provider will ask about your symptoms and ask for details about any injury. You may also have other tests, including:  Electromyogram (EMG). This test checks how well the nerve is working.  X-ray.  How is this treated? Treatment starts by stopping the activities that are causing your symptoms to get worse. Treatment may include the use of icing and medicines to reduce pain and swelling. You may also be advised to wear a splint to prevent your elbow from bending or wear an elbow pad where the ulnar nerve is closest to the skin. In less severe cases, treatment may also include working with a physical therapist:  To help decrease your symptoms.  To improve the strength and range of motion of your elbow, forearm, and hand.  If the treatments described above do not help, surgery may be needed. Follow these instructions at home: If you have a splint:  Wear it as told by your health care provider. Remove it only as told by your health care provider.  Loosen the splint if your fingers become numb and tingle, or if they turn cold and blue.  Keep the splint clean and dry. Managing pain, stiffness, and swelling  If directed, apply ice to the injured area: ? Put ice in a plastic bag. ?  Place a towel between your skin and the bag. ? Leave the ice on for 20 minutes, 2-3 times per day.  Move your fingers often to avoid stiffness and to lessen swelling.  Raise (elevate) the injured area above the level of your heart while you are sitting or lying down. General instructions  Take over-the-counter and prescription medicines only as told by your health care provider.  Keep all follow-up visits as told by your health care provider. This is  important.  Do any exercise or physical therapy as told by your health care provider.  Do not drive or operate heavy machinery while taking prescription pain medicine.  If you were given an elbow pad, wear it as told by your health care provider. Contact a health care provider if:  Your symptoms get worse.  Your symptoms do not get better with treatment.  Your have new pain.  Your hand on the injured side feels numb or cold. This information is not intended to replace advice given to you by your health care provider. Make sure you discuss any questions you have with your health care provider. Document Released: 03/21/2005 Document Revised: 08/27/2015 Document Reviewed: 05/28/2014 Elsevier Interactive Patient Education  2018 [employer]    Golfer's Elbow Golfer's elbow, also called medial epicondylitis, is a condition that results from inflammation of the strong bands of tissue (tendons) that attach your forearm muscles to the inside of your bone at the elbow. These tendons affect the muscles that bend the palm toward the wrist (flexion). This condition is called golfer's elbow because it is more common among people who constantly bend and twist their wrists, such as golfers. This injury usually results from overuse. Tendons also become less flexible with age. This condition causes elbow pain that may spread to your forearm and upper arm. The pain may get worse when you bend your wrist downward. What are the causes? This condition is an overuse injury that is caused by:  Repeatedly flexing, turning, or twisting your wrist.  Constantly gripping objects with your hands.  What increases the risk? This condition is more likely to develop in people who play golf or tennis or have jobs that require the constant use of their hands. This injury is more common among:  Carpenters.  Gardeners.  Musicians.  Bricklayers.  Typists.  What are the signs or symptoms? Symptoms of this  condition include:  Pain near the inner elbow or forearm.  Reduced grip strength.  How is this diagnosed? This condition is diagnosed based on your symptoms, medical history, and physical exam. During the exam, your health care provider may test your grip strength and move your wrist to check for pain. You may also have an MRI to confirm the diagnosis, look for other issues, and check for tears in the ligaments, muscles, or tendons. How is this treated? Treatment for this condition includes:  Stopping all activities that make you bend or twist your wrist until your pain and other symptoms go away.  Icing your wrist to relieve pain.  Taking NSAIDs or getting corticosteroid injections to reduce pain and swelling.  Doing stretches, range-of-motion, and strengthening exercises (physical therapy) as told by your health care provider.  In rare cases, surgery may be needed if your condition does not improve. Follow these instructions at home:  If directed, apply ice to the injured area. ? Put ice in a plastic bag. ? Place a towel between your skin and the bag. ? Leave the ice on for  20 minutes, 2-3 times a day.  Move your fingers often to avoid stiffness.  Raise (elevate) the injured area above the level of your heart while you are sitting or lying down.  Return to your normal activities as told by your health care provider. Ask your health care provider what activities are safe for you.  Do exercises as told by your health care provider.  Do not use tobacco products, including cigarettes, chewing tobacco, or e-cigarettes. If you need help quitting, ask your health care provider.  Take over-the-counter and prescription medicines only as told by your health care provider.  Keep all follow-up visits as told by your health care provider. This is important. How is this prevented?  Warm up and stretch before being active.  Cool down and stretch after being active.  Give your body  time to rest between periods of activity.  Make sure to use equipment that fits you.  Be safe and responsible while being active to avoid falls.  Do at least 150 minutes of moderate-intensity exercise each week, such as brisk walking or water aerobics.  Maintain physical fitness, including: ? Strength. ? Flexibility. ? Cardiovascular fitness. ? Endurance.  Perform exercises to strengthen the forearm muscles.  Slow your golf swing to reduce shock in the arm when making contact with the ball, if you play golf. Contact a health care provider if:  Your pain does not improve or it gets worse.  You notice numbness in your hand. Get help right away if:  Your pain is severe.  You cannot move your wrist. This information is not intended to replace advice given to you by your health care provider. Make sure you discuss any questions you have with your health care provider. Document Released: 03/21/2005 Document Revised: 11/24/2015 Document Reviewed: 12/01/2014 Elsevier Interactive Patient Education  [employer]2018 [employer]

## 2017-01-20 NOTE — Progress Notes (Signed)
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 He was pulling a rope. The rope snapped and resulting in pain in his left elbow and shoulder. He was seen by his workers compensation physician earlier this week who obtained an x-ray that was reportedly normal. He's been using an elbow brace which has not helped. He notes pain in the left medial elbow along with some radiating pain to his left shoulder. He also notes pain in his left trapezius. He notes pain is reproducible and his elbow with wrist motion and his pain in his left trapezius is reproducible with shoulder abduction and shoulder shrug. He does not think that he hit his elbow on anything with the rope gave way. He denies any bruising. He denies any fevers or chills. He is unable to work currently because of the pain.  Past medical history, Surgical history, Family history not pertinant except as noted below, Social history, Allergies, and medications have been entered into the medical record, reviewed, and no changes needed.   Review of Systems: No headache, visual changes, nausea, vomiting, diarrhea, constipation, dizziness, abdominal pain, skin rash, fevers, chills, night sweats, weight loss, swollen lymph nodes, body aches, joint swelling, muscle aches, chest pain, shortness of breath, mood changes, visual or auditory hallucinations.   Objective:    Vitals:   01/20/17 1051  BP: (!) 144/84  Pulse: 81   General: Well Developed, well nourished, and in no acute distress.  Neuro/Psych: Alert and oriented x3, extra-ocular muscles intact, able to move all 4 extremities, sensation grossly intact. Skin: Warm and dry, no rashes noted.  Respiratory: Not using accessory muscles, speaking in full sentences, trachea midline.  Cardiovascular: Pulses palpable, no extremity edema. Abdomen: Does not appear distended. MSK:  C-spine nontender posterior  midline. Tender palpation left trapezius Neck range of motion is limited especially with left lateral flexion and rotation reproducing pain. Left shoulder normal-appearing nontender. Some pain with shoulder abduction present.  Left elbow normal-appearing. Range of motion limited extension 5-120 Tender to palpation medial upper condyle. Positive Tinel's overlying cubital tunnel. Pain and medial epicondyle is reproducible with wrist flexion and grip. Pulses capillary refill sensation and strength are intact distally.   No results found for this or any previous visit (from the past 24 hour(s)). No results found.  Impression and Recommendations:    Assessment and Plan: 31 y.o. male with  Left shoulder and elbow pain. I suspect patient suffered a strain of the forearm and hand flexors resulting in electively medial epicondylitis. He also appears to have sensitivity to the ulnar nerve at the cubital tunnel. I'm doubtful of an isolated ulnar nerve injury. I suspect is more inflammatory. I think the left trapezius pain is likely secondary to him not using his arm normally and suffering some trapezius dysfunction.  The case is a bit complicated because this is a workers compensation case. I am acting as a second opinion.   Plan for trial of oral steroid Dosepak and gabapentin. Recheck in 1 week. If not better would proceed with early imaging of the elbow..    No orders of the defined types were placed in this encounter.  Meds ordered this encounter  Medications  . predniSONE (STERAPRED UNI-PAK 48 TAB) 5 MG (48) TBPK tablet    Sig: 12 day dosepack po    Dispense:  48 tablet    Refill:  0  . gabapentin (NEURONTIN) 300 MG  capsule    Sig: One tab PO qHS for a week, then BID for a week, then TID. May double weekly to a max of 3,600mg /day    Dispense:  180 capsule    Refill:  3    Discussed warning signs or symptoms. Please see discharge instructions. Patient expresses  understanding.

## 2017-01-27 ENCOUNTER — Encounter: Payer: Self-pay | Admitting: Family Medicine

## 2017-01-27 ENCOUNTER — Ambulatory Visit (INDEPENDENT_AMBULATORY_CARE_PROVIDER_SITE_OTHER): Admitting: Family Medicine

## 2017-01-27 VITALS — BP 152/90 | HR 90 | Wt 208.0 lb

## 2017-01-27 DIAGNOSIS — M7702 Medial epicondylitis, left elbow: Secondary | ICD-10-CM

## 2017-01-27 DIAGNOSIS — G5622 Lesion of ulnar nerve, left upper limb: Secondary | ICD-10-CM | POA: Diagnosis not present

## 2017-01-27 NOTE — Progress Notes (Signed)
Note sent to requested recipient via epic.  

## 2017-01-27 NOTE — Progress Notes (Signed)
Cole [name] is a 31 y.o. male who presents to [facility] today for all of the left arm pain.  Patient was seen October 19 for left elbow pain.  This occurred at work when he was pulling on a rope which snapped.  He felt immediate pain in his medial elbow.  He subsequently has developed pain and numbness and tingling radiating to the ulnar.  He was seen originally by a occupational medicine physician in saw me for a second.  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  On check I was concerned for irritation or injury to the common flexor tendon insertion at the medial epicondyle and injury to the ulnar nerve at the cubital tunnel.  Fortunately he had intact strength and sensation to his hand the hand.  I prescribed prednisone and gabapentin and is here today for follow-up.  He notes slightly improved symptoms but is still quite painful at the medial elbow and continues to experience numbness and tingly sensation to the ulnar hand at times.  He is unable to work currently.  Past medical social and surgical history reviewed   ROS:  As above   Medications: Medication and allergies reviewed  Exam:  BP (!) 152/90   Pulse 90   Wt 208 lb (94.3 kg)   BMI 27.44 kg/m  General: Well Developed, well nourished, and in no acute distress.  Neuro/Psych: Alert and oriented x3, extra-ocular muscles intact, able to move all 4 extremities, sensation grossly intact. Skin: Warm and dry, no rashes noted.  Respiratory: Not using accessory muscles, speaking in full sentences, trachea midline.  Cardiovascular: Pulses palpable, no extremity edema. Abdomen: Does not appear distended. MSK:  Left elbow normal-appearing no ecchymosis no deformity. Tender to palpation at the medial epicondyle.  Pain with resisted wrist flexion. Positive Tinel's at cubital tunnel.  Paresthesias are reproducible with elbow flexed position. Hand strength is intact distally.  Limited  musculoskeletal ultrasound of the left medial elbow region is a normal-appearing bony structure.  The flexor tendons are visualized with no obvious tears. The cubital tunnel is identified and the ulnar nerve is identified present in the cubital tunnel.  There appears to be hypoechoic enlarged nerve fascicles seen at the ulnar nerve at the cubital tunnel and proximal to the cubital tunnel.  Patient has reproducible paresthesias with compression of the nerve with the ultrasound probe.  No pulsatile structures or vascular flow seen within the hypoechoic circular fascicle structure.     No results found for this or any previous visit (from the past 48 hour(s)). No results found.    Assessment and Plan: 31 y.o. male with  Left elbow pain likely combination of flexor tendon injury as well as potential ulnar nerve injury.  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 Patient remains quite symptomatic more than 2 weeks after an injury to his elbow.  The symptoms are profound enough that he is unable to work currently.  Pain to obtain an MRI of the elbow to look for a tendon injury.  We will obtain a nerve conduction study if patient is still symptomatic more than 6 weeks after the injury. Plan to continue prednisone.  Recheck in 2 weeks.    Orders Placed This Encounter  Procedures  . MR ELBOW LEFT WO CONTRAST    Standing Status:   Future    Standing Expiration Date:   03/29/2018    Order Specific Question:   What is the patient's  sedation requirement?    Answer:   No Sedation    Order Specific Question:   Does the patient have a pacemaker or implanted devices?    Answer:   No    Order Specific Question:   [facility]?    [facility]    Order Specific Question:   Radiology Contrast Protocol - do NOT remove file path    Answer:   [email or web address]   No orders of the defined types  were placed in this encounter.   Discussed warning signs or symptoms. Please see discharge instructions. Patient expresses understanding.  CC: [facility] [phone]

## 2017-01-27 NOTE — Patient Instructions (Signed)
Thank you for coming in today. You should hear about the MRI soon.  Recheck in 2 weeks.  Take gabapentin as needed for pain.   Cubital Tunnel Syndrome Cubital tunnel syndrome is a condition that causes pain and weakness of the forearm and hand. This condition happens when one of the nerves (ulnar nerve) that runs alongside the elbow joint becomes irritated. What are the causes? Causes of this condition include:  Increased pressure on the ulnar nerve at the elbow, arm, or forearm. This can be caused by: ? Swollen tissues. ? Ligaments. ? Muscles. ? Poorly healed elbow fractures. ? Tumors in the elbow. These are usually noncancerous (benign). ? Scar tissue that develops in the elbow after an injury. ? Bony growths (spurs) near the ulnar nerve.  Stretching of the nerve due to loose elbow ligaments.  Trauma to the nerve at the elbow.  Repetitive elbow bending.  Certain medical conditions.  What increases the risk? This condition is more likely to develop in:  People who do manual labor that requires frequently bending the elbow.  People who play sports that include repeated or strenuous throwing motions, such as baseball.  People who play contact sports, such as football or lacrosse.  People who do not warm up properly before activities.  People who have diabetes.  People who have an underactive thyroid (hypothyroidism).  What are the signs or symptoms? Symptoms of this condition include:  Clumsiness or weakness of the hand.  Tenderness of the inner elbow.  Aching or soreness of the inner elbow, forearm, or fingers, especially the little finger or the ring finger.  Increased pain with forced elbow bending.  Reduced control when throwing.  Tingling, numbness, or burning inside the forearm, or in part of the hand or fingers, especially the little finger or the ring finger.  Sharp pains that shoot from the elbow down to the wrist and hand.  The inability to grip or  pinch hard.  How is this diagnosed? This condition is diagnosed with a medical history and physical exam. Your health care provider will ask about your symptoms and ask for details about any injury. You may also have other tests, including:  Electromyogram (EMG). This test checks how well the nerve is working.  X-ray.  How is this treated? Treatment starts by stopping the activities that are causing your symptoms to get worse. Treatment may include the use of icing and medicines to reduce pain and swelling. You may also be advised to wear a splint to prevent your elbow from bending or wear an elbow pad where the ulnar nerve is closest to the skin. In less severe cases, treatment may also include working with a physical therapist:  To help decrease your symptoms.  To improve the strength and range of motion of your elbow, forearm, and hand.  If the treatments described above do not help, surgery may be needed. Follow these instructions at home: If you have a splint:  Wear it as told by your health care provider. Remove it only as told by your health care provider.  Loosen the splint if your fingers become numb and tingle, or if they turn cold and blue.  Keep the splint clean and dry. Managing pain, stiffness, and swelling  If directed, apply ice to the injured area: ? Put ice in a plastic bag. ? Place a towel between your skin and the bag. ? Leave the ice on for 20 minutes, 2-3 times per day.  Move your fingers often  to avoid stiffness and to lessen swelling.  Raise (elevate) the injured area above the level of your heart while you are sitting or lying down. General instructions  Take over-the-counter and prescription medicines only as told by your health care provider.  Keep all follow-up visits as told by your health care provider. This is important.  Do any exercise or physical therapy as told by your health care provider.  Do not drive or operate heavy machinery while  taking prescription pain medicine.  If you were given an elbow pad, wear it as told by your health care provider. Contact a health care provider if:  Your symptoms get worse.  Your symptoms do not get better with treatment.  Your have new pain.  Your hand on the injured side feels numb or cold. This information is not intended to replace advice given to you by your health care provider. Make sure you discuss any questions you have with your health care provider. Document Released: 03/21/2005 Document Revised: 08/27/2015 Document Reviewed: 05/28/2014 Elsevier Interactive Patient Education  2018 Elsevier Inc.    Golfer's Elbow Golfer's elbow, also called medial epicondylitis, is a condition that results from inflammation of the strong bands of tissue (tendons) that attach your forearm muscles to the inside of your bone at the elbow. These tendons affect the muscles that bend the palm toward the wrist (flexion). This condition is called golfer's elbow because it is more common among people who constantly bend and twist their wrists, such as golfers. This injury usually results from overuse. Tendons also become less flexible with age. This condition causes elbow pain that may spread to your forearm and upper arm. The pain may get worse when you bend your wrist downward. What are the causes? This condition is an overuse injury that is caused by:  Repeatedly flexing, turning, or twisting your wrist.  Constantly gripping objects with your hands.  What increases the risk? This condition is more likely to develop in people who play golf or tennis or have jobs that require the constant use of their hands. This injury is more common among:  Carpenters.  Gardeners.  Musicians.  Bricklayers.  Typists.  What are the signs or symptoms? Symptoms of this condition include:  Pain near the inner elbow or forearm.  Reduced grip strength.  How is this diagnosed? This condition is  diagnosed based on your symptoms, medical history, and physical exam. During the exam, your health care provider may test your grip strength and move your wrist to check for pain. You may also have an MRI to confirm the diagnosis, look for other issues, and check for tears in the ligaments, muscles, or tendons. How is this treated? Treatment for this condition includes:  Stopping all activities that make you bend or twist your wrist until your pain and other symptoms go away.  Icing your wrist to relieve pain.  Taking NSAIDs or getting corticosteroid injections to reduce pain and swelling.  Doing stretches, range-of-motion, and strengthening exercises (physical therapy) as told by your health care provider.  In rare cases, surgery may be needed if your condition does not improve. Follow these instructions at home:  If directed, apply ice to the injured area. ? Put ice in a plastic bag. ? Place a towel between your skin and the bag. ? Leave the ice on for 20 minutes, 2-3 times a day.  Move your fingers often to avoid stiffness.  Raise (elevate) the injured area above the level of your heart  while you are sitting or lying down.  Return to your normal activities as told by your health care provider. Ask your health care provider what activities are safe for you.  Do exercises as told by your health care provider.  Do not use tobacco products, including cigarettes, chewing tobacco, or e-cigarettes. If you need help quitting, ask your health care provider.  Take over-the-counter and prescription medicines only as told by your health care provider.  Keep all follow-up visits as told by your health care provider. This is important. How is this prevented?  Warm up and stretch before being active.  Cool down and stretch after being active.  Give your body time to rest between periods of activity.  Make sure to use equipment that fits you.  Be safe and responsible while being active  to avoid falls.  Do at least 150 minutes of moderate-intensity exercise each week, such as brisk walking or water aerobics.  Maintain physical fitness, including: ? Strength. ? Flexibility. ? Cardiovascular fitness. ? Endurance.  Perform exercises to strengthen the forearm muscles.  Slow your golf swing to reduce shock in the arm when making contact with the ball, if you play golf. Contact a health care provider if:  Your pain does not improve or it gets worse.  You notice numbness in your hand. Get help right away if:  Your pain is severe.  You cannot move your wrist. This information is not intended to replace advice given to you by your health care provider. Make sure you discuss any questions you have with your health care provider. Document Released: 03/21/2005 Document Revised: 11/24/2015 Document Reviewed: 12/01/2014 Elsevier Interactive Patient Education  [employer]2018 Elsevier Inc.

## 2017-02-06 ENCOUNTER — Ambulatory Visit (INDEPENDENT_AMBULATORY_CARE_PROVIDER_SITE_OTHER): Admitting: Physician Assistant

## 2017-02-06 ENCOUNTER — Encounter: Payer: Self-pay | Admitting: Physician Assistant

## 2017-02-06 VITALS — BP 128/88 | HR 102 | Wt 209.0 lb

## 2017-02-06 DIAGNOSIS — L508 Other urticaria: Secondary | ICD-10-CM

## 2017-02-06 DIAGNOSIS — Z889 Allergy status to unspecified drugs, medicaments and biological substances status: Secondary | ICD-10-CM | POA: Diagnosis not present

## 2017-02-06 MED ORDER — EPINEPHRINE 0.3 MG/0.3ML IJ SOAJ: 0.3000 mg | Freq: Once | INTRAMUSCULAR | 0 refills | Status: AC

## 2017-02-06 MED ORDER — CETIRIZINE HCL 10 MG PO TABS: 10.0000 mg | ORAL_TABLET | Freq: Every day | ORAL | 11 refills | Status: DC
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
## 2017-02-06 NOTE — Patient Instructions (Signed)
Angioedema Angioedema is the sudden swelling of tissue in the body. Angioedema can affect any part of the body, but it most often affects the deeper parts of the skin, causing red, itchy patches (hives) to appear over the affected area. It often begins during the night and is found in the morning. Depending on the cause, angioedema may happen:  Only once.  Several times. It may come back in unpredictable patterns.  Repeatedly for several years. Over time, it may gradually stop coming back.  Angioedema can be life-threatening if it affects the air passages that you breathe through. What are the causes? This condition may be caused by:  Foods, such as milk, eggs, shellfish, wheat, or nuts.  Certain medicines, such as ACE inhibitors, antibiotics, nonsteroidal anti-inflammatory drugs, birth control pills, or dyes used in X-rays.  Insect stings.  Infections.  Angioedema can be inherited, and episodes can be triggered by:  Mild injury.  Dental work.  Surgery.  Stress.  Sudden changes in temperature.  Exercise.  In some cases, the cause of this condition is not known. What are the signs or symptoms? Symptoms of this condition depend on where the swelling happens. Symptoms may include:  Swollen skin.  Red, itchy patches of skin (hives).  Redness in the affected area.  Pain in the affected area.  Swollen lips or tongue.  Wheezing.  Breathing problems.  If your internal organs are involved, symptoms may also include:  Nausea.  Abdominal pain.  Vomiting.  Difficulty swallowing.  Difficulty passing urine.  How is this diagnosed? This condition may be diagnosed based on:  An exam of the affected area.  Your medical history.  Whether anyone in your family has had this condition before.  A review of any medicines you have been taking.  Tests, including: ? Allergy skin tests to see if the condition was caused by an allergic reaction. ? Blood tests to see  if the condition was caused by a gene. ? Tests to check for underlying diseases that could cause the condition.  How is this treated? Treatment for this condition depends on the cause. It may involve any of the following:  If something triggered the condition, making changes to keep it from triggering the condition again.  If the condition affects your breathing, having tubes placed in your airway to keep it open.  Taking medicines to treat symptoms or prevent future episodes. These may include: ? Antihistamines. ? Epinephrine injections. ? Steroids.  If your condition is severe, you may need to be treated at the hospital. Angioedema usually gets better in 24-48 hours. Follow these instructions at home:  Take over-the-counter and prescription medicines only as told by your health care provider.  If you were given medicines for emergency allergy treatment, always carry them with you.  Wear a medical bracelet as told by your health care provider.  If something triggers your condition, avoid the trigger, if possible.  If your condition is inherited and you are thinking about having children, talk to your health care provider. It is important to discuss the risks of passing on the condition to your children. Contact a health care provider if:  You have repeated episodes of angioedema.  Episodes of angioedema start to happen more often than they used to, even after you take steps to prevent them.  You have episodes of angioedema that are more severe than they have been before, even after you take steps to prevent them.  You are thinking about having children. Get   help right away if:  You have severe swelling of your mouth, tongue, or lips.  You have trouble breathing.  You have trouble swallowing.  You faint. This information is not intended to replace advice given to you by your health care provider. Make sure you discuss any questions you have with your health care  provider. Document Released: 05/30/2001 Document Revised: 10/17/2015 Document Reviewed: 09/29/2015 Elsevier Interactive Patient Education  2018 [employer].  

## 2017-02-09 ENCOUNTER — Telehealth: Payer: Self-pay | Admitting: Family Medicine

## 2017-02-09 NOTE — Telephone Encounter (Signed)
[employer]([facility] [employer]) called imaging to cancel MRI scheduled for Monday. States they need to approve scan, and [place]. Attempted to contact Pt to make sure he is OK with this- no answer and VM is full.   Need workers [employer] info to send them MRI order.

## 2017-02-10 ENCOUNTER — Ambulatory Visit: Admitting: Family Medicine

## 2017-02-10 DIAGNOSIS — Z0189 Encounter for other specified special examinations: Secondary | ICD-10-CM

## 2017-02-13 ENCOUNTER — Ambulatory Visit (INDEPENDENT_AMBULATORY_CARE_PROVIDER_SITE_OTHER): Payer: Worker's Compensation

## 2017-02-13 DIAGNOSIS — S59902A Unspecified injury of left elbow, initial encounter: Secondary | ICD-10-CM

## 2017-02-13 DIAGNOSIS — X509XXA Other and unspecified overexertion or strenuous movements or postures, initial encounter: Secondary | ICD-10-CM

## 2017-02-13 DIAGNOSIS — M7702 Medial epicondylitis, left elbow: Secondary | ICD-10-CM | POA: Diagnosis not present

## 2017-02-21 ENCOUNTER — Other Ambulatory Visit: Payer: Self-pay | Admitting: Physician Assistant

## 2017-02-21 DIAGNOSIS — F411 Generalized anxiety disorder: Secondary | ICD-10-CM

## 2017-02-21 DIAGNOSIS — F339 Major depressive disorder, recurrent, unspecified: Secondary | ICD-10-CM

## 2017-02-22 ENCOUNTER — Other Ambulatory Visit: Payer: Self-pay | Admitting: Orthopedic Surgery

## 2017-02-22 DIAGNOSIS — M75112 Incomplete rotator cuff tear or rupture of left shoulder, not specified as traumatic: Secondary | ICD-10-CM

## 2017-02-27 ENCOUNTER — Ambulatory Visit
Admission: RE | Admit: 2017-02-27 | Discharge: 2017-02-27 | Disposition: A | Discharge status: Home / Self Care | Payer: Worker's Compensation | Source: Ambulatory Visit | Attending: Orthopedic Surgery | Admitting: Orthopedic Surgery

## 2017-02-27 DIAGNOSIS — M75112 Incomplete rotator cuff tear or rupture of left shoulder, not specified as traumatic: Secondary | ICD-10-CM

## 2017-02-28 ENCOUNTER — Ambulatory Visit

## 2017-03-07 ENCOUNTER — Ambulatory Visit (INDEPENDENT_AMBULATORY_CARE_PROVIDER_SITE_OTHER): Admitting: Physician Assistant

## 2017-03-07 ENCOUNTER — Encounter: Payer: Self-pay | Admitting: Physician Assistant

## 2017-03-07 VITALS — BP 132/92 | HR 94 | Ht 73.0 in | Wt 210.0 lb

## 2017-03-07 DIAGNOSIS — Z131 Encounter for screening for diabetes mellitus: Secondary | ICD-10-CM | POA: Diagnosis not present

## 2017-03-07 DIAGNOSIS — Z13220 Encounter for screening for lipoid disorders: Secondary | ICD-10-CM | POA: Diagnosis not present

## 2017-03-07 DIAGNOSIS — R030 Elevated blood-pressure reading, without diagnosis of hypertension: Secondary | ICD-10-CM

## 2017-03-07 DIAGNOSIS — F339 Major depressive disorder, recurrent, unspecified: Secondary | ICD-10-CM | POA: Diagnosis not present

## 2017-03-07 DIAGNOSIS — R000 Tachycardia, unspecified: Secondary | ICD-10-CM | POA: Diagnosis not present

## 2017-03-07 DIAGNOSIS — F900 Attention-deficit hyperactivity disorder, predominantly inattentive type: Secondary | ICD-10-CM

## 2017-03-07 MED ORDER — BUPROPION HCL ER (XL) 300 MG PO TB24: 300.0000 mg | ORAL_TABLET | Freq: Every day | ORAL | 3 refills | Status: DC

## 2017-03-07 NOTE — Progress Notes (Addendum)
Subjective:    Patient ID: Cole [name], male    DOB: 08/23/1985, 31 y.o.   MRN: [id number]  HPI Mr. Cole [name] is a 31 y/o male who presents for follow-up of anxiety and depression. He started Wellbutrin in September and reports a small improvement in his mood and anxiety, however states that wife has noticed more of an improvement than he has. He reports about a 25% improvement in his mood. He reports decreased irritability, being more clearheaded, and less nervous/anxious. He denies mood swings, sleep changes, and SI/HI. He recently had an elbow injury that has kept him out of work since 01/16/17. He initially enjoyed the time off from work, however is starting to want to get back to work to keep himself busy.  He restarted his Metoprolol in September and reports taking it daily as prescribed. He denies chest pain, palpitations, SOB, edema, dizziness, headaches or visual disturbances. He reports that his BP has remained in the 130s-140s/80s-90s during his office visits for his recent elbow injury. Patient is still smoking and is not interested in quitting at this time.  .. Active Ambulatory Problems    Diagnosis Date Noted  . ALCOHOL WITHDRAWAL 08/04/2009  . DRUG ABUSE, IN REMISSION 08/25/2009  . DEPRESSIVE DISORDER NOT ELSEWHERE CLASSIFIED 11/17/2008  . MIGRAINE, CLASSICAL, INTRACTABLE 03/12/2009  . SINUS TACHYCARDIA 06/29/2009  . 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 DISC DISEASE, LUMBAR 01/14/2009  . LOW BACK PAIN, CHRONIC 11/17/2008  . SYNCOPE 07/27/2009  . NUMBNESS 12/15/2008  . NAUSEA WITH VOMITING 03/12/2009  . DYSURIA 01/06/2009  . ELEVATED BLOOD PRESSURE WITHOUT DIAGNOSIS OF HYPERTENSION 02/10/2009  . ADHD (attention deficit hyperactivity disorder) 10/11/2012  . History of narcotic addiction (HCC) 10/29/2012  . Attention deficit hyperactivity disorder (ADHD), 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  . Toe injury 07/18/2014  . Tachycardia 12/18/2014  .  Depression, recurrent (HCC) 12/26/2016  . GAD (generalized anxiety disorder) 12/26/2016  . Left elbow pain 01/20/2017  . History of allergic reaction 02/06/2017   Resolved Ambulatory Problems    Diagnosis Date Noted  . FRACTURE, FOOT 12/09/2008  . Burn of unspecified degree of forearm 01/26/2009  . Tinea pedis of both feet 07/18/2014   Past Medical History:  Diagnosis Date  . Alcoholism (HCC)   . Anxiety   . Chronic pain syndrome   . DDD (degenerative disc disease)   . Depression   . Drug abuse (HCC)   . History of ADHD   . Hyperlipidemia      Review of Systems  All other systems reviewed and are negative.      Objective:   Physical Exam  Constitutional: He is oriented to person, place, and time. He appears well-developed and well-nourished.  HENT:  Head: Normocephalic and atraumatic.  Cardiovascular: Normal rate, regular rhythm and normal heart sounds.  Pulmonary/Chest: Effort normal and breath sounds normal.  Neurological: He is alert and oriented to person, place, and time.  Skin: Skin is warm and dry.        Assessment & Plan:  .Marland Kitchen.Marland Kitchen.Diagnoses and all orders for this visit:  Attention deficit hyperactivity disorder (ADHD), predominantly inattentive type  Depression, recurrent (HCC)  Screening for lipid disorders -     Lipid Panel w/reflex Direct LDL  Screening for diabetes mellitus -     COMPLETE METABOLIC PANEL WITH GFR  Tachycardia  ELEVATED BLOOD PRESSURE WITHOUT DIAGNOSIS OF HYPERTENSION  Other orders -     buPROPion (WELLBUTRIN XL) 300 MG 24 hr tablet; Take 1 tablet (300  mg total) by mouth daily.   Depression & ADHD - Patient reports mild improvement in depression symptoms since starting Wellbutrin, however has not noticed much improvement in his focus and ADHD. He does not report any medication side effects. Increase Wellbutrin to 300mg  daily. Encouraged patient to exercise as allowed due to his elbow injury to help improve mood.  Elevated  blood pressure - Patient restarted Metoprolol and has had improvement in his HR but BP has remained elevated. 144/94 mmHg at the beginning of the visit and 132/92 mmHg when rechecked at the end of the visit. Continue on Metoprolol 25 mg as prescribed. Fasting CMP and lipid panel ordered. .. Depression screen [employer] 2/9 03/07/2017 12/26/2016  Decreased Interest 2 2  Down, Depressed, Hopeless 1 2  PHQ - 2 Score 3 4  Altered sleeping 0 0  Tired, decreased energy 1 2  Change in appetite 2 3  Feeling bad or failure about yourself  1 3  Trouble concentrating 1 2  Moving slowly or fidgety/restless 1 1  Suicidal thoughts 0 0  PHQ-9 Score 9 15  Difficult doing work/chores Not difficult at all -   ... GAD 7 : Generalized Anxiety Score 03/07/2017 12/26/2016  Nervous, Anxious, on Edge 1 3  Control/stop worrying 1 3  Worry too much - different things 1 3  Trouble relaxing 1 3  Restless 1 1  Easily annoyed or irritable 1 3  Afraid - awful might happen 0 1  Total GAD 7 Score 6 17    Follow up in 6 months.

## 2017-03-10 ENCOUNTER — Emergency Department (INDEPENDENT_AMBULATORY_CARE_PROVIDER_SITE_OTHER)
Admission: EM | Admit: 2017-03-10 | Discharge: 2017-03-10 | Disposition: A | Discharge status: Home / Self Care | Source: Home / Self Care | Attending: Family Medicine | Admitting: Family Medicine

## 2017-03-10 ENCOUNTER — Other Ambulatory Visit: Payer: Self-pay

## 2017-03-10 DIAGNOSIS — I10 Essential (primary) hypertension: Secondary | ICD-10-CM | POA: Diagnosis not present

## 2017-03-10 DIAGNOSIS — R002 Palpitations: Secondary | ICD-10-CM

## 2017-03-10 HISTORY — DX: Essential (primary) hypertension: I10

## 2017-03-10 MED ORDER — METOPROLOL SUCCINATE ER 50 MG PO TB24: 50.0000 mg | ORAL_TABLET | Freq: Every day | ORAL | 1 refills | Status: DC
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
## 2017-03-10 NOTE — Discharge Instructions (Signed)
Begin increased dose of Toprol on Saturday 03/11/17. Monitor blood pressure and pulse more frequently at different times of day and record on a calendar.

## 2017-03-10 NOTE — ED Triage Notes (Signed)
Pt stated that he has had a high heartrate for about a week now.  Stated that it has not been below 125.  No other sx.  Stated that he had rectal bleeding several days ago, but is unsure if it is related.

## 2017-03-21 ENCOUNTER — Other Ambulatory Visit: Payer: Self-pay | Admitting: Physician Assistant

## 2017-03-21 DIAGNOSIS — F339 Major depressive disorder, recurrent, unspecified: Secondary | ICD-10-CM

## 2017-03-21 DIAGNOSIS — F411 Generalized anxiety disorder: Secondary | ICD-10-CM

## 2017-04-06 DIAGNOSIS — S43439A Superior glenoid labrum lesion of unspecified shoulder, initial encounter: Secondary | ICD-10-CM | POA: Insufficient documentation

## 2017-08-30 ENCOUNTER — Other Ambulatory Visit: Payer: Self-pay | Admitting: Physical Medicine and Rehabilitation

## 2017-08-30 DIAGNOSIS — M5030 Other cervical disc degeneration, unspecified cervical region: Secondary | ICD-10-CM

## 2017-09-07 ENCOUNTER — Ambulatory Visit
Admission: RE | Admit: 2017-09-07 | Discharge: 2017-09-07 | Disposition: A | Discharge status: Home / Self Care | Payer: Worker's Compensation | Source: Ambulatory Visit | Attending: Physical Medicine and Rehabilitation | Admitting: Physical Medicine and Rehabilitation

## 2017-09-07 DIAGNOSIS — M5030 Other cervical disc degeneration, unspecified cervical region: Secondary | ICD-10-CM

## 2017-09-14 DIAGNOSIS — M5412 Radiculopathy, cervical region: Secondary | ICD-10-CM | POA: Insufficient documentation

## 2018-03-06 ENCOUNTER — Telehealth: Payer: Self-pay | Admitting: Physician Assistant

## 2018-03-06 DIAGNOSIS — F411 Generalized anxiety disorder: Secondary | ICD-10-CM

## 2018-03-06 DIAGNOSIS — F339 Major depressive disorder, recurrent, unspecified: Secondary | ICD-10-CM

## 2018-03-06 DIAGNOSIS — F900 Attention-deficit hyperactivity disorder, predominantly inattentive type: Secondary | ICD-10-CM

## 2018-03-06 NOTE — Telephone Encounter (Signed)
Done

## 2018-03-15 ENCOUNTER — Encounter (HOSPITAL_COMMUNITY): Payer: Self-pay | Admitting: Psychiatry

## 2018-03-15 ENCOUNTER — Ambulatory Visit (INDEPENDENT_AMBULATORY_CARE_PROVIDER_SITE_OTHER): Admitting: Psychiatry

## 2018-03-15 VITALS — BP 170/100 | HR 108 | Ht 73.0 in | Wt 208.0 lb

## 2018-03-15 DIAGNOSIS — F339 Major depressive disorder, recurrent, unspecified: Secondary | ICD-10-CM

## 2018-03-15 DIAGNOSIS — F411 Generalized anxiety disorder: Secondary | ICD-10-CM

## 2018-03-15 DIAGNOSIS — F1121 Opioid dependence, in remission: Secondary | ICD-10-CM | POA: Diagnosis not present

## 2018-03-15 MED ORDER — ESCITALOPRAM OXALATE 10 MG PO TABS: 10.0000 mg | ORAL_TABLET | Freq: Every day | ORAL | 1 refills | Status: DC
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
## 2018-03-15 NOTE — Patient Instructions (Signed)
Refer to therapy  Avoid alcohol  Increase activity with kids

## 2018-04-03 ENCOUNTER — Encounter (HOSPITAL_COMMUNITY): Payer: Self-pay | Admitting: Licensed Clinical Social Worker

## 2018-04-03 ENCOUNTER — Ambulatory Visit (INDEPENDENT_AMBULATORY_CARE_PROVIDER_SITE_OTHER): Admitting: Licensed Clinical Social Worker

## 2018-04-03 DIAGNOSIS — F1121 Opioid dependence, in remission: Secondary | ICD-10-CM | POA: Diagnosis not present

## 2018-04-03 DIAGNOSIS — Z87828 Personal history of other (healed) physical injury and trauma: Secondary | ICD-10-CM | POA: Diagnosis not present

## 2018-04-03 DIAGNOSIS — F602 Antisocial personality disorder: Secondary | ICD-10-CM | POA: Diagnosis not present
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
## 2018-04-12 ENCOUNTER — Ambulatory Visit (HOSPITAL_COMMUNITY): Admitting: Psychiatry

## 2018-04-12 ENCOUNTER — Telehealth (HOSPITAL_COMMUNITY): Payer: Self-pay | Admitting: Licensed Clinical Social Worker

## 2018-04-12 NOTE — Telephone Encounter (Signed)
Provided patient with contact information for two therapists who would likely be a good fit for his needs. 1.  Dorann Lodge at [facility] in [place] [phone]  2.  Mikeal Hawthorne at [facility] [address] [place], [place] [phone]  Patient indicated he would look into setting up an appointment with Dorann Lodge.

## 2018-04-12 NOTE — Telephone Encounter (Signed)
Patient states he was to be referred out by you and would like to know the status of this.   CB # [phone]

## 2018-07-24 ENCOUNTER — Ambulatory Visit (INDEPENDENT_AMBULATORY_CARE_PROVIDER_SITE_OTHER): Admitting: Family Medicine

## 2018-07-24 ENCOUNTER — Ambulatory Visit (INDEPENDENT_AMBULATORY_CARE_PROVIDER_SITE_OTHER)

## 2018-07-24 ENCOUNTER — Other Ambulatory Visit: Payer: Self-pay

## 2018-07-24 ENCOUNTER — Encounter: Payer: Self-pay | Admitting: Family Medicine

## 2018-07-24 VITALS — BP 155/110 | HR 110 | Temp 98.1°F | Wt 194.7 lb

## 2018-07-24 DIAGNOSIS — M79672 Pain in left foot: Secondary | ICD-10-CM

## 2018-07-24 MED ORDER — DICLOFENAC SODIUM 1 % TD GEL: 4.0000 g | Freq: Four times a day (QID) | TRANSDERMAL | 11 refills | Status: DC

## 2018-07-24 MED ORDER — COLCHICINE 0.6 MG PO TABS: 0.6000 mg | ORAL_TABLET | Freq: Every day | ORAL | 2 refills | Status: DC
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
## 2018-07-24 NOTE — Patient Instructions (Signed)
Thank you for coming in today. Get labs now.  Take colchicine for about 1 week.  Use the shoe as needed.  Apply the diclofenac gel for pain as needed.  If not improving recheck in 1-2  Weeks.   I will get results to you asap.    Gout  Gout is painful swelling of your joints. Gout is a type of arthritis. It is caused by having too much uric acid in your body. Uric acid is a chemical that is made when your body breaks down substances called purines. If your body has too much uric acid, sharp crystals can form and build up in your joints. This causes pain and swelling. Gout attacks can happen quickly and be very painful (acute gout). Over time, the attacks can affect more joints and happen more often (chronic gout). What are the causes?  Too much uric acid in your blood. This can happen because: ? Your kidneys do not remove enough uric acid from your blood. ? Your body makes too much uric acid. ? You eat too many foods that are high in purines. These foods include organ meats, some seafood, and beer.  Trauma or stress. What increases the risk?  Having a family history of gout.  Being male and middle-aged.  Being male and having gone through menopause.  Being very overweight (obese).  Drinking alcohol, especially beer.  Not having enough water in the body (being dehydrated).  Losing weight too quickly.  Having an organ transplant.  Having lead poisoning.  Taking certain medicines.  Having kidney disease.  Having a skin condition called psoriasis. What are the signs or symptoms? An attack of acute gout usually happens in just one joint. The most common place is the big toe. Attacks often start at night. Other joints that may be affected include joints of the feet, ankle, knee, fingers, wrist, or elbow. Symptoms of an attack may include:  Very bad pain.  Warmth.  Swelling.  Stiffness.  Shiny, red, or purple skin.  Tenderness. The affected joint may be very  painful to touch.  Chills and fever. Chronic gout may cause symptoms more often. More joints may be involved. You may also have white or yellow lumps (tophi) on your hands or feet or in other areas near your joints. How is this treated?  Treatment for this condition has two phases: treating an acute attack and preventing future attacks.  Acute gout treatment may include: ? NSAIDs. ? Steroids. These are taken by mouth or injected into a joint. ? Colchicine. This medicine relieves pain and swelling. It can be given by mouth or through an IV tube.  Preventive treatment may include: ? Taking small doses of NSAIDs or colchicine daily. ? Using a medicine that reduces uric acid levels in your blood. ? Making changes to your diet. You may need to see a food expert (dietitian) about what to eat and drink to prevent gout. Follow these instructions at home: During a gout attack   If told, put ice on the painful area: ? Put ice in a plastic bag. ? Place a towel between your skin and the bag. ? Leave the ice on for 20 minutes, 2-3 times a day.  Raise (elevate) the painful joint above the level of your heart as often as you can.  Rest the joint as much as possible. If the joint is in your leg, you may be given crutches.  Follow instructions from your doctor about what you cannot eat or  drink. Avoiding future gout attacks  Eat a low-purine diet. Avoid foods and drinks such as: ? Liver. ? Kidney. ? Anchovies. ? Asparagus. ? Herring. ? Mushrooms. ? Mussels. ? Beer.  Stay at a healthy weight. If you want to lose weight, talk with your doctor. Do not lose weight too fast.  Start or continue an exercise plan as told by your doctor. Eating and drinking  Drink enough fluids to keep your pee (urine) pale yellow.  If you drink alcohol: ? Limit how much you use to:  0-1 drink a day for women.  0-2 drinks a day for men. ? Be aware of how much alcohol is in your drink. In the [place] one  drink equals one 12 oz bottle of beer (355 mL), one 5 oz glass of wine (148 mL), or one 1 oz glass of hard liquor (44 mL). General instructions  Take over-the-counter and prescription medicines only as told by your doctor.  Do not drive or use heavy machinery while taking prescription pain medicine.  Return to your normal activities as told by your doctor. Ask your doctor what activities are safe for you.  Keep all follow-up visits as told by your doctor. This is important. Contact a doctor if:  You have another gout attack.  You still have symptoms of a gout attack after 10 days of treatment.  You have problems (side effects) because of your medicines.  You have chills or a fever.  You have burning pain when you pee (urinate).  You have pain in your lower back or belly. Get help right away if:  You have very bad pain.  Your pain cannot be controlled.  You cannot pee. Summary  Gout is painful swelling of the joints.  The most common site of pain is the big toe, but it can affect other joints.  Medicines and avoiding some foods can help to prevent and treat gout attacks. This information is not intended to replace advice given to you by your health care provider. Make sure you discuss any questions you have with your health care provider. Document Released: 12/29/2007 Document Revised: 10/11/2017 Document Reviewed: 10/11/2017 Elsevier Interactive Patient Education  2019 [employer]  Colchicine tablets or capsules What is this medicine? COLCHICINE (KOL chi seen) is used to prevent or treat attacks of acute gout or gouty arthritis. This medicine is also used to treat familial Mediterranean fever. This medicine may be used for other purposes; ask your health care provider or pharmacist if you have questions. COMMON BRAND NAME(S): Colcrys, MITIGARE What should I tell my health care provider before I take this medicine? They need to know if you have any of these  conditions: -kidney disease -liver disease -an unusual or allergic reaction to colchicine, other medicines, foods, dyes, or preservatives -pregnant or trying to get pregnant -breast-feeding How should I use this medicine? Take this medicine by mouth with a full glass of water. Follow the directions on the prescription label. You can take it with or without food. If it upsets your stomach, take it with food. Take your medicine at regular intervals. Do not take your medicine more often than directed. A special MedGuide will be given to you by the pharmacist with each prescription and refill. Be sure to read this information carefully each time. Talk to your pediatrician regarding the use of this medicine in children. While this drug may be prescribed for children as young as 8 years old for selected conditions, precautions do apply. Patients  over 33 years old may have a stronger reaction and need a smaller dose. Overdosage: If you think you have taken too much of this medicine contact a poison control center or emergency room at once. NOTE: This medicine is only for you. Do not share this medicine with others. What if I miss a dose? If you miss a dose, take it as soon as you can. If it is almost time for your next dose, take only that dose. Do not take double or extra doses. What may interact with this medicine? Do not take this medicine with any of the following medications: -certain antivirals for HIV or hepatitis This medicine may also interact with the following medications: -certain antibiotics like erythromycin or clarithromycin -certain medicines for blood pressure, heart disease, irregular heart beat -certain medicines for cholesterol like atorvastatin, lovastatin, and simvastatin -certain medicines for fungal infections like ketoconazole, itraconazole, or posaconazole -cyclosporine -grapefruit or grapefruit juice This list may not describe all possible interactions. Give your health  care provider a list of all the medicines, herbs, non-prescription drugs, or dietary supplements you use. Also tell them if you smoke, drink alcohol, or use illegal drugs. Some items may interact with your medicine. What should I watch for while using this medicine? Visit your healthcare professional for regular checks on your progress. Tell your healthcare professional if your symptoms do not start to get better or if they get worse. You should make sure you get enough vitamin B12 while you are taking this medicine. Discuss the foods you eat and the vitamins you take with your healthcare professional. This medicine may increase your risk to bruise or bleed. Call you healthcare professional if you notice any unusual bleeding. What side effects may I notice from receiving this medicine? Side effects that you should report to your doctor or health care professional as soon as possible: -allergic reactions like skin rash, itching or hives, swelling of the face, lips, or tongue -low blood counts - this medicine may decrease the number of white blood cells, red blood cells, and platelets. You may be at increased risk for infections and bleeding -pain, tingling, numbness in the hands or feet -severe diarrhea -signs and symptoms of infection like fever; chills; cough; sore throat; pain or trouble passing urine -signs and symptoms of muscle injury like dark urine; trouble passing urine or change in the amount of urine; unusually weak or tired; muscle pain; back pain -unusual bleeding or bruising -unusually weak or tired -vomiting Side effects that usually do not require medical attention (report these to your doctor or health care professional if they continue or are bothersome): -mild diarrhea -nausea -stomach pain This list may not describe all possible side effects. Call your doctor for medical advice about side effects. You may report side effects to FDA at 1-800-FDA-1088. Where should I keep my  medicine? Keep out of the reach of children. Store at room temperature between 15 and 30 degrees C (59 and 86 degrees F). Keep container tightly closed. Protect from light. Throw away any unused medicine after the expiration date. NOTE: This sheet is a summary. It may not cover all possible information. If you have questions about this medicine, talk to your doctor, pharmacist, or health care provider.  2019 Elsevier/Gold Standard (2017-06-07 18:45:11)

## 2018-07-26 LAB — CBC
HCT: 43 % (ref 38.5–50.0)
Hemoglobin: 14.7 g/dL (ref 13.2–17.1)
MCH: 31.6 pg (ref 27.0–33.0)
MCHC: 34.2 g/dL (ref 32.0–36.0)
MCV: 92.5 fL (ref 80.0–100.0)
MPV: 9.9 fL (ref 7.5–12.5)
Platelets: 257 10*3/uL (ref 140–400)
RBC: 4.65 10*6/uL (ref 4.20–5.80)
RDW: 12.1 % (ref 11.0–15.0)
WBC: 8.3 10*3/uL (ref 3.8–10.8)

## 2018-07-26 LAB — COMPLETE METABOLIC PANEL WITH GFR
AG Ratio: 1.3 (calc) (ref 1.0–2.5)
ALT: 15 U/L (ref 9–46)
AST: 19 U/L (ref 10–40)
Albumin: 4.4 g/dL (ref 3.6–5.1)
Alkaline phosphatase (APISO): 85 U/L (ref 36–130)
BUN: 16 mg/dL (ref 7–25)
CO2: 30 mmol/L (ref 20–32)
Calcium: 9.7 mg/dL (ref 8.6–10.3)
Chloride: 101 mmol/L (ref 98–110)
Creat: 0.84 mg/dL (ref 0.60–1.35)
GFR, Est African American: 134 mL/min/{1.73_m2} (ref >=60)
GFR, Est Non African American: 116 mL/min/{1.73_m2} (ref >=60)
Globulin: 3.3 g/dL (calc) (ref 1.9–3.7)
Glucose, Bld: 137 mg/dL — ABNORMAL HIGH (ref 65–99)
Potassium: 4.5 mmol/L (ref 3.5–5.3)
Sodium: 139 mmol/L (ref 135–146)
Total Bilirubin: 0.4 mg/dL (ref 0.2–1.2)
Total Protein: 7.7 g/dL (ref 6.1–8.1)

## 2018-07-26 LAB — URIC ACID: Uric Acid, Serum: 8.1 mg/dL — ABNORMAL HIGH (ref 4.0–8.0)

## 2018-07-26 MED ORDER — ALLOPURINOL 100 MG PO TABS: 100.0000 mg | ORAL_TABLET | Freq: Every day | ORAL | 0 refills | Status: DC

## 2018-07-26 NOTE — Addendum Note (Signed)
Addended by: Rodolph Bong on: 07/26/2018 07:05 AM   Modules accepted: Orders

## 2018-09-25 ENCOUNTER — Ambulatory Visit (HOSPITAL_COMMUNITY): Admitting: Licensed Clinical Social Worker

## 2018-10-01 ENCOUNTER — Ambulatory Visit (HOSPITAL_COMMUNITY): Admitting: Licensed Clinical Social Worker

## 2018-10-11 ENCOUNTER — Other Ambulatory Visit: Payer: Self-pay

## 2018-10-11 ENCOUNTER — Emergency Department (INDEPENDENT_AMBULATORY_CARE_PROVIDER_SITE_OTHER)
Admission: EM | Admit: 2018-10-11 | Discharge: 2018-10-11 | Disposition: A | Discharge status: Home / Self Care | Source: Home / Self Care | Attending: Family Medicine | Admitting: Family Medicine

## 2018-10-11 DIAGNOSIS — T783XXA Angioneurotic edema, initial encounter: Secondary | ICD-10-CM | POA: Diagnosis not present

## 2018-10-11 DIAGNOSIS — L500 Allergic urticaria: Secondary | ICD-10-CM

## 2018-10-11 MED ORDER — PREDNISONE 20 MG PO TABS: ORAL_TABLET | ORAL | 0 refills | Status: DC

## 2018-10-11 MED ORDER — EPINEPHRINE 0.3 MG/0.3ML IJ SOAJ
0.3000 mg | Freq: Once | INTRAMUSCULAR | Status: AC
  Administered 2018-10-11: 0.3 mg via INTRAMUSCULAR

## 2018-10-11 MED ORDER — METHYLPREDNISOLONE SODIUM SUCC 125 MG IJ SOLR
80.0000 mg | Freq: Once | INTRAMUSCULAR | Status: AC
  Administered 2018-10-11: 80 mg via INTRAMUSCULAR

## 2018-10-11 MED ORDER — DIPHENHYDRAMINE HCL 50 MG PO CAPS
50.0000 mg | ORAL_CAPSULE | Freq: Once | ORAL | Status: AC
  Administered 2018-10-11: 50 mg via ORAL

## 2018-10-11 NOTE — ED Triage Notes (Signed)
Noticed Hives around 11 am and lip swelling began about 15 minutes prior to arrival.  Lip swelling worse since being here.  Itchy throat for about 45 minutes.
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
## 2018-10-11 NOTE — Discharge Instructions (Signed)
Begin prednisone Friday 10/12/18. Take Benadryl 25mg , two caps (or tabs) every 6 hours until improved. Rest, increase fluid intake.  If symptoms become significantly worse during the night or over the weekend, proceed to the local emergency room.

## 2018-10-16 ENCOUNTER — Encounter: Payer: Self-pay | Admitting: Physician Assistant

## 2018-10-16 ENCOUNTER — Ambulatory Visit (INDEPENDENT_AMBULATORY_CARE_PROVIDER_SITE_OTHER): Admitting: Physician Assistant

## 2018-10-16 VITALS — Ht 73.0 in | Wt 187.0 lb

## 2018-10-16 DIAGNOSIS — T783XXD Angioneurotic edema, subsequent encounter: Secondary | ICD-10-CM

## 2018-10-16 DIAGNOSIS — T783XXA Angioneurotic edema, initial encounter: Secondary | ICD-10-CM | POA: Insufficient documentation

## 2018-10-16 DIAGNOSIS — F332 Major depressive disorder, recurrent severe without psychotic features: Secondary | ICD-10-CM | POA: Diagnosis not present

## 2018-10-16 DIAGNOSIS — T7840XD Allergy, unspecified, subsequent encounter: Secondary | ICD-10-CM | POA: Diagnosis not present

## 2018-10-16 DIAGNOSIS — T7840XA Allergy, unspecified, initial encounter: Secondary | ICD-10-CM | POA: Insufficient documentation

## 2018-10-16 MED ORDER — PREDNISONE 20 MG PO TABS: ORAL_TABLET | ORAL | 0 refills | Status: DC

## 2018-10-16 MED ORDER — EPINEPHRINE 0.3 MG/0.3ML IJ SOAJ
0.3000 mg | INTRAMUSCULAR | 1 refills | Status: DC | PRN
Start: 2018-10-16 — End: 2019-07-01
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
## 2018-10-16 NOTE — Progress Notes (Signed)
Swelling better. Still happens occasionally but area changes.

## 2018-10-26 ENCOUNTER — Ambulatory Visit: Payer: Self-pay | Admitting: Allergy

## 2018-10-26 ENCOUNTER — Other Ambulatory Visit: Payer: Self-pay | Admitting: Physician Assistant

## 2018-10-26 DIAGNOSIS — T783XXD Angioneurotic edema, subsequent encounter: Secondary | ICD-10-CM

## 2018-10-26 DIAGNOSIS — T7840XD Allergy, unspecified, subsequent encounter: Secondary | ICD-10-CM
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
## 2018-10-30 ENCOUNTER — Telehealth: Payer: Self-pay

## 2018-10-30 LAB — SARS-COV-2 RNA,(COVID-19) QUALITATIVE NAAT: SARS CoV2 RNA: NOT DETECTED

## 2018-10-30 NOTE — Telephone Encounter (Signed)
Pt given COVID results.

## 2018-11-07 ENCOUNTER — Other Ambulatory Visit: Payer: Self-pay | Admitting: Family Medicine

## 2018-11-09 ENCOUNTER — Ambulatory Visit (INDEPENDENT_AMBULATORY_CARE_PROVIDER_SITE_OTHER): Admitting: Physician Assistant

## 2018-11-09 ENCOUNTER — Telehealth: Payer: Self-pay | Admitting: Neurology

## 2018-11-09 VITALS — Ht 72.0 in | Wt 174.0 lb

## 2018-11-09 DIAGNOSIS — F332 Major depressive disorder, recurrent severe without psychotic features: Secondary | ICD-10-CM | POA: Diagnosis not present

## 2018-11-09 DIAGNOSIS — F339 Major depressive disorder, recurrent, unspecified: Secondary | ICD-10-CM | POA: Diagnosis not present

## 2018-11-09 MED ORDER — CITALOPRAM HYDROBROMIDE 20 MG PO TABS: 20.0000 mg | ORAL_TABLET | Freq: Every day | ORAL | 1 refills | Status: DC
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
## 2018-11-09 NOTE — Progress Notes (Signed)
Patient wants to discuss going back on Citalopram. Feels like allergic reaction wasn't from that. PHQ9-GAD7 completed.

## 2018-11-09 NOTE — Telephone Encounter (Signed)
Patient on the schedule to speak with [facility] today.

## 2018-11-09 NOTE — Telephone Encounter (Signed)
Patient left vm about wanting to restart Celexa after allergic reaction. [phone].

## 2018-11-10 ENCOUNTER — Encounter: Payer: Self-pay | Admitting: Physician Assistant

## 2018-11-19 ENCOUNTER — Ambulatory Visit (INDEPENDENT_AMBULATORY_CARE_PROVIDER_SITE_OTHER): Admitting: Licensed Clinical Social Worker

## 2018-11-19 DIAGNOSIS — F339 Major depressive disorder, recurrent, unspecified: Secondary | ICD-10-CM

## 2018-11-19 DIAGNOSIS — F411 Generalized anxiety disorder: Secondary | ICD-10-CM | POA: Diagnosis not present

## 2018-11-19 NOTE — Progress Notes (Signed)
   THERAPIST PROGRESS NOTE  Session Time: 11-11:45  Participation Level: Minimal  Behavioral Response: NAAlertDysphoric  Type of Therapy: Individual Therapy  Treatment Goals addressed: Communication: Expressing Emotion and Coping  Interventions: CBT, Psychosocial Skills: Feelings awareness and communication and Supportive  Summary: Cole [name] is a 33 y.o. male who presents with hx of depression, anxiety, and antisocial bx. He reports he wants to work on his communication and relationship w/ his wife. PT states he has started attending AA at the request of his wife. He has not drank since 09/10/18 and feels "more clear headed". Before that he describes drinking daily for past 15 years. 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 He has recently started medication for depression since he had a reaction to Celexa around 1 month into it. PT's phone cuts out 45 min into session and when counselor tries to call back there is no response and no ability to leave a Voicemail.   Suicidal/Homicidal: Nowithout intent/plan  Therapist Response: I used open questions, active listening, and summarizing reflections to help PT address his long stemming communication blocks. PT admits he feels "cold and distant" towards the people he loves but he does not want to be this way. He believes he could be on the Autism Spectrum but has not been dx professionally.  Plan: Return again in 2 weeks.  Diagnosis:    ICD-10-CM   1. Depression, recurrent ([place])  F33.9   2. GAD (generalized anxiety disorder)  F41.1       Archie Balboa, LCAS-A 11/19/2018

## 2018-11-19 NOTE — Progress Notes (Signed)
Virtual Visit via Video Note  I connected with Cole [name] on 11/19/18 at 11:00 AM EDT by a video enabled telemedicine application and verified that I am speaking with the correct person using two identifiers.  Location: Patient: Home Provider: Office   I discussed the limitations of evaluation and management by telemedicine and the availability of in person appointments. The patient expressed understanding and agreed to proceed.     I discussed the assessment and treatment plan with the patient. The patient was provided an opportunity to ask questions and all were answered. The patient agreed with the plan and demonstrated an understanding of the instructions.   The patient was advised to call back or seek an in-person evaluation if the symptoms worsen or if the condition fails to improve as anticipated.  I provided 45 minutes of non-face-to-face time during this encounter.   Archie Balboa, LCAS-A

## 2018-11-20 ENCOUNTER — Other Ambulatory Visit: Payer: Self-pay

## 2018-11-20 ENCOUNTER — Ambulatory Visit (INDEPENDENT_AMBULATORY_CARE_PROVIDER_SITE_OTHER): Admitting: Allergy and Immunology

## 2018-11-20 ENCOUNTER — Encounter: Payer: Self-pay | Admitting: Allergy and Immunology

## 2018-11-20 VITALS — BP 130/82 | HR 97 | Temp 97.3°F | Resp 18 | Ht 72.0 in | Wt 186.8 lb

## 2018-11-20 DIAGNOSIS — T7840XD Allergy, unspecified, subsequent encounter: Secondary | ICD-10-CM

## 2018-11-20 DIAGNOSIS — J310 Chronic rhinitis: Secondary | ICD-10-CM | POA: Diagnosis not present

## 2018-11-20 DIAGNOSIS — T783XXD Angioneurotic edema, subsequent encounter: Secondary | ICD-10-CM

## 2018-11-20 DIAGNOSIS — L500 Allergic urticaria: Secondary | ICD-10-CM

## 2018-11-20 MED ORDER — FAMOTIDINE 20 MG PO TABS: 20.0000 mg | ORAL_TABLET | Freq: Two times a day (BID) | ORAL | 5 refills | Status: DC

## 2018-11-20 MED ORDER — LEVOCETIRIZINE DIHYDROCHLORIDE 5 MG PO TABS: 5.0000 mg | ORAL_TABLET | Freq: Every day | ORAL | 5 refills | Status: DC | PRN

## 2018-11-20 MED ORDER — FLUTICASONE PROPIONATE 50 MCG/ACT NA SUSP: 2.0000 | Freq: Every day | NASAL | 5 refills | Status: DC | PRN
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
## 2018-11-20 NOTE — Patient Instructions (Addendum)
Allergic reaction/recurrent urticaria The patients history suggests allergic reaction with an unclear trigger. Food allergen skin tests were negative today despite a positive histamine control. The negative predictive value for skin tests is excellent (greater than 95%). We will proceed with in vitro lab studies to help establish an etiology.  The following labs have been ordered: FCeRI antibody, anti-thyroglobulin antibody, thyroid peroxidase antibody, baseline serum tryptase, CBC, CMP, ESR, ANA, and serum specific IgE against alpha gal panel.   Instructions have been discussed and provided for H1/H2 receptor blockade with titration to find lowest effective dose.  A prescription has been provided for levocetirizine (Xyzal), 5 mg daily as needed.  A prescription has been provided for famotidine (Pepcid) 20 mg twice daily if needed.  Should symptoms recur, a journal is to be kept recording any foods eaten, beverages consumed, medications taken within a 6 hour period prior to the onset of symptoms, as well as activities performed, and environmental conditions. For any symptoms concerning for anaphylaxis, epinephrine is to be administered and 911 is to be called immediately.  Chronic rhinitis  Levocetirizine has been prescribed (as above).  A prescription has been provided for fluticasone nasal spray, 2 sprays per nostril daily as needed. Proper nasal spray technique has been discussed and demonstrated.  Nasal saline spray (i.e., Simply Saline) or nasal saline lavage (i.e., NeilMed) is recommended as needed and prior to medicated nasal sprays.   When lab results have returned the patient will be called with further recommendations.   Urticaria (Hives)  . Levocetirizine (Xyzal) 5 mg twice a day and famotidine (Pepcid) 20 mg twice a day. If no symptoms for 7-14 days then decrease to. . Levocetirizine (Xyzal) 5 mg twice a day and famotidine (Pepcid) 20 mg once a day.  If no symptoms for 7-14  days then decrease to. . Levocetirizine (Xyzal) 5 mg twice a day.  If no symptoms for 7-14 days then decrease to. . Levocetirizine (Xyzal) 5 mg once a day.  May use Benadryl (diphenhydramine) as needed for breakthrough symptoms       If symptoms return, then step up dosage

## 2018-11-20 NOTE — Assessment & Plan Note (Signed)
   Levocetirizine has been prescribed (as above).  A prescription has been provided for fluticasone nasal spray, 2 sprays per nostril daily as needed. Proper nasal spray technique has been discussed and demonstrated.  Nasal saline spray (i.e., Simply Saline) or nasal saline lavage (i.e., NeilMed) is recommended as needed and prior to medicated nasal sprays. 

## 2018-11-20 NOTE — Assessment & Plan Note (Signed)
The patients history suggests allergic reaction with an unclear trigger. Food allergen skin tests were negative today despite a positive histamine control. The negative predictive value for skin tests is excellent (greater than 95%). We will proceed with in vitro lab studies to help establish an etiology.  The following labs have been ordered: FCeRI antibody, anti-thyroglobulin antibody, thyroid peroxidase antibody, baseline serum tryptase, CBC, CMP, ESR, ANA, and serum specific IgE against alpha gal panel.   Instructions have been discussed and provided for H1/H2 receptor blockade with titration to find lowest effective dose.  A prescription has been provided for levocetirizine (Xyzal), 5 mg daily as needed.  A prescription has been provided for famotidine (Pepcid) 20 mg twice daily if needed.  Should symptoms recur, a journal is to be kept recording any foods eaten, beverages consumed, medications taken within a 6 hour period prior to the onset of symptoms, as well as activities performed, and environmental conditions. For any symptoms concerning for anaphylaxis, epinephrine is to be administered and 911 is to be called immediately.

## 2018-12-06 ENCOUNTER — Ambulatory Visit: Admitting: Physician Assistant

## 2018-12-10 DIAGNOSIS — F330 Major depressive disorder, recurrent, mild: Secondary | ICD-10-CM | POA: Insufficient documentation

## 2018-12-10 DIAGNOSIS — F4325 Adjustment disorder with mixed disturbance of emotions and conduct: Secondary | ICD-10-CM | POA: Insufficient documentation

## 2018-12-13 ENCOUNTER — Encounter (HOSPITAL_COMMUNITY): Payer: Self-pay | Admitting: Licensed Clinical Social Worker

## 2018-12-13 ENCOUNTER — Ambulatory Visit (INDEPENDENT_AMBULATORY_CARE_PROVIDER_SITE_OTHER): Admitting: Licensed Clinical Social Worker

## 2018-12-13 DIAGNOSIS — F602 Antisocial personality disorder: Secondary | ICD-10-CM

## 2018-12-13 NOTE — Progress Notes (Signed)
Virtual Visit via Telephone Note  I connected with Tawni Carnes on 12/13/18 at  9:00 AM EDT by telephone and verified that I am speaking with the correct person using two identifiers.  Location: Patient: Work Provider: Biomedical scientist   I discussed the limitations, risks, security and privacy concerns of performing an evaluation and management service by telephone and the availability of in person appointments. I also discussed with the patient that there may be a patient responsible charge related to this service. The patient expressed understanding and agreed to proceed.     I discussed the assessment and treatment plan with the patient. The patient was provided an opportunity to ask questions and all were answered. The patient agreed with the plan and demonstrated an understanding of the instructions.   The patient was advised to call back or seek an in-person evaluation if the symptoms worsen or if the condition fails to improve as anticipated.  I provided 20 minutes of non-face-to-face time during this encounter.   Archie Balboa, LCAS-A     I called PT and he is at work. He states that he thought our appointment was later today in the afternoon. I remind him of the reminder calls he receives to help him prepare for session on time. I spend time checking in w/ PT's current status and ask him to reschedule for an in-person visit which he states will "be much better for his learning style". We agree on a date and time. I remind him to document the meeting in a calendar so he remembers. He verbalizes understanding. I assure him if he misses next appt we will not continue scheduling w/ him.

## 2018-12-14 ENCOUNTER — Ambulatory Visit (INDEPENDENT_AMBULATORY_CARE_PROVIDER_SITE_OTHER): Admitting: Physician Assistant

## 2018-12-14 ENCOUNTER — Other Ambulatory Visit: Payer: Self-pay

## 2018-12-14 VITALS — BP 141/75 | HR 100 | Ht 72.0 in | Wt 180.0 lb

## 2018-12-14 DIAGNOSIS — R413 Other amnesia: Secondary | ICD-10-CM

## 2018-12-14 DIAGNOSIS — F419 Anxiety disorder, unspecified: Secondary | ICD-10-CM | POA: Diagnosis not present

## 2018-12-14 DIAGNOSIS — R4586 Emotional lability: Secondary | ICD-10-CM | POA: Diagnosis not present

## 2018-12-14 DIAGNOSIS — F430 Acute stress reaction: Secondary | ICD-10-CM | POA: Diagnosis not present

## 2018-12-14 MED ORDER — LAMOTRIGINE 25 MG PO TABS: 25.0000 mg | ORAL_TABLET | Freq: Every day | ORAL | 1 refills | Status: DC

## 2018-12-14 MED ORDER — HYDROXYZINE PAMOATE 25 MG PO CAPS: 25.0000 mg | ORAL_CAPSULE | Freq: Three times a day (TID) | ORAL | 0 refills | Status: DC | PRN

## 2018-12-14 MED ORDER — CITALOPRAM HYDROBROMIDE 40 MG PO TABS: 40.0000 mg | ORAL_TABLET | Freq: Every day | ORAL | 0 refills | Status: DC

## 2018-12-14 NOTE — Progress Notes (Signed)
Subjective:    Patient ID: Cole [name], male    DOB: [birth year], 33 y.o.   MRN: [id number]  HPI  Pt is a 33 yo male with hx of substance abuse, ADHD, Depression, anxiety who presents to the clinic to discuss episodes of "blacking out".  Patient has been having these episodes as well since January 2020 after his wife stated she was considering leaving him.  For no known reason he will black out for a unknown period of time. He is active per other people during this time but not responsive. He does not remember anything done or said during this period. At times this can last for hours. One example his wife gives is she will ask him a question and he does not answer and he goes and gets his car keys and starts driving.  Patient does not remember this occurring.  At times he will have symptoms  during his workday he does not know what he is doing.  He has been able to keep his job and no one has made any formal complaints.  He is working 40 hours a week.  He admits he is not sleeping well.  He wakes up a lot at night.  He is in counseling every week to every other week.  He is trying to work things out with his wife but does not know the status of that yet.  He denies any suicidal thoughts or ideations.  He is back on Celexa but does not know if it is really helping.  He has not had any more of the angioedema.  He went to ED on 12/09/2018 CT of the head was unremarkable.  He does have periods where he feels out of control, clammy, sweaty they seem to resolve in 5 to 20 minutes.  .. Active Ambulatory Problems    Diagnosis Date Noted  . ALCOHOL WITHDRAWAL 08/04/2009  . DRUG ABUSE, IN REMISSION 08/25/2009  . DEPRESSIVE DISORDER NOT ELSEWHERE CLASSIFIED 11/17/2008  . MIGRAINE, CLASSICAL, INTRACTABLE 03/12/2009  . SINUS TACHYCARDIA 06/29/2009  . 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 DISC DISEASE, LUMBAR 01/14/2009  . LOW BACK PAIN, CHRONIC 11/17/2008  . SYNCOPE 07/27/2009  . NAUSEA  WITH VOMITING 03/12/2009  . ELEVATED BLOOD PRESSURE WITHOUT DIAGNOSIS OF HYPERTENSION 02/10/2009  . ADHD (attention deficit hyperactivity disorder) 10/11/2012  . History of narcotic addiction ([facility]) 10/29/2012  . Attention deficit hyperactivity disorder (ADHD), 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  . Tachycardia 12/18/2014  . Depression, recurrent ([facility]) 12/26/2016  . GAD (generalized anxiety disorder) 12/26/2016  . History of allergic reaction 02/06/2017  . Angioedema 10/16/2018  . Allergic reaction/recurrent urticaria 10/16/2018  . 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 Recurrent urticaria 11/20/2018  . Chronic rhinitis 11/20/2018  . Transient amnesia 12/17/2018  . Anxiety 12/17/2018  . Acute stress reaction 12/17/2018   Resolved Ambulatory Problems    Diagnosis Date Noted  . NUMBNESS 12/15/2008  . Dysuria 01/06/2009  . FRACTURE, FOOT 12/09/2008  . Burn of unspecified degree of forearm 01/26/2009  . Toe injury 07/18/2014  . Tinea pedis of both feet 07/18/2014  . Left elbow pain 01/20/2017   Past Medical History:  Diagnosis Date  . Alcoholism ([facility])   . Chronic pain syndrome   . DDD (degenerative disc disease)   . Depression   . Drug abuse ([facility])   . History of ADHD   . Hyperlipidemia   . Hypertension      Review  of Systems  All other systems reviewed and are negative.      Objective:   Physical Exam Vitals signs reviewed.  Constitutional:      Appearance: Normal appearance.  HENT:     Head: Normocephalic.  Cardiovascular:     Rate and Rhythm: Normal rate and regular rhythm.  Pulmonary:     Effort: Pulmonary effort is normal.  Musculoskeletal:     Comments: No strength changes. 5/5 upper and lower ext.   Neurological:     General: No focal deficit present.     Mental Status: He is alert and oriented to person, place, and time.     Motor: No weakness.     Coordination: Coordination normal.      Gait: Gait normal.     Deep Tendon Reflexes: Reflexes normal.  Psychiatric:     Comments: Very flat. Absolutely no emotion.            Assessment & Plan:  Marland KitchenMarland KitchenDemitrius was seen today for memory loss.  Diagnoses and all orders for this visit:  Transient amnesia -     B12 and Folate Panel -     Methylmalonic Acid -     VITAMIN D 25 Hydroxy (Vit-D Deficiency, Fractures) -     TSH -     lamoTRIgine (LAMICTAL) 25 MG tablet; Take 1 tablet (25 mg total) by mouth daily. Increase to 2 tablets daily after 1 week. -     citalopram (CELEXA) 40 MG tablet; Take 1 tablet (40 mg total) by mouth daily. -     Ambulatory referral to Psychiatry -     Ambulatory referral to Neurology  Anxiety -     B12 and Folate Panel -     Methylmalonic Acid -     VITAMIN D 25 Hydroxy (Vit-D Deficiency, Fractures) -     TSH -     lamoTRIgine (LAMICTAL) 25 MG tablet; Take 1 tablet (25 mg total) by mouth daily. Increase to 2 tablets daily after 1 week. -     citalopram (CELEXA) 40 MG tablet; Take 1 tablet (40 mg total) by mouth daily. -     Ambulatory referral to Psychiatry  Acute stress reaction -     B12 and Folate Panel -     Methylmalonic Acid -     VITAMIN D 25 Hydroxy (Vit-D Deficiency, Fractures) -     TSH -     lamoTRIgine (LAMICTAL) 25 MG tablet; Take 1 tablet (25 mg total) by mouth daily. Increase to 2 tablets daily after 1 week. -     citalopram (CELEXA) 40 MG tablet; Take 1 tablet (40 mg total) by mouth daily. -     Ambulatory referral to Psychiatry  Mood changes -     lamoTRIgine (LAMICTAL) 25 MG tablet; Take 1 tablet (25 mg total) by mouth daily. Increase to 2 tablets daily after 1 week. -     citalopram (CELEXA) 40 MG tablet; Take 1 tablet (40 mg total) by mouth daily. -     Ambulatory referral to Psychiatry  Other orders -     hydrOXYzine (VISTARIL) 25 MG capsule; Take 1 capsule (25 mg total) by mouth 3 (three) times daily as needed for anxiety.     .. GAD 7 : Generalized  Anxiety Score 12/14/2018 11/09/2018 03/07/2017 12/26/2016  Nervous, Anxious, on Edge 1 1 1 3   Control/stop worrying 3 3 1 3   Worry too much - different things 3 2 1 3   Trouble relaxing  2 2 1 3   Restless 1 1 1 1   Easily annoyed or irritable 3 3 1 3   Afraid - awful might happen 1 1 0 1  Total GAD 7 Score 14 13 6 17   Anxiety Difficulty Very difficult Very difficult - -    .Marland Kitchen. Depression screen [facility] 2/9 12/14/2018 11/09/2018 03/07/2017 12/26/2016  Decreased Interest 2 1 2 2   Down, Depressed, Hopeless 2 2 1 2   PHQ - 2 Score 4 3 3 4   Altered sleeping 2 1 0 0  Tired, decreased energy 2 1 1 2   Change in appetite 3 3 2 3   Feeling bad or failure about yourself  3 2 1 3   Trouble concentrating 2 2 1 2   Moving slowly or fidgety/restless 1 1 1 1   Suicidal thoughts 0 1 0 0  PHQ-9 Score 17 14 9 15   Difficult doing work/chores Very difficult Very difficult Not difficult at all -    .. Montreal Cognitive Assessment  12/14/2018  Visuospatial/ Executive (0/5) 4  Naming (0/3) 3  Attention: Read list of digits (0/2) 2  Attention: Read list of letters (0/1) 1  Attention: Serial 7 subtraction starting at 100 (0/3) 3  Language: Repeat phrase (0/2) 2  Language : Fluency (0/1) 1  Abstraction (0/2) 2  Delayed Recall (0/5) 5  Orientation (0/6) 6  Total 29  Adjusted Score (based on education) 29    Normal mental status exam today.   This does not present like classic neurological issues. I suspect more psych. [facility]. Continue with counseling regularly.   Increased celexa to 40mg  today. Added lamictal 25mg  and increase to 50mg  after 2 weeks.  Vistaril as needed for panic attacks. I would like to stay away from any controlled substance with patients history.   Follow up as needed.   Marland Kitchen..Spent 30 minutes with patient and greater than 50 percent of visit spent counseling patient regarding treatment plan.

## 2018-12-14 NOTE — Patient Instructions (Addendum)
Increase celexa to 40mg  and then start lamictal.  Vistaril as needed for panic attacks.   Will make referral to psychiatry.

## 2018-12-17 ENCOUNTER — Encounter: Payer: Self-pay | Admitting: Physician Assistant

## 2018-12-17 DIAGNOSIS — F419 Anxiety disorder, unspecified: Secondary | ICD-10-CM | POA: Insufficient documentation

## 2018-12-17 DIAGNOSIS — R413 Other amnesia: Secondary | ICD-10-CM | POA: Insufficient documentation

## 2018-12-17 DIAGNOSIS — F430 Acute stress reaction: Secondary | ICD-10-CM | POA: Insufficient documentation

## 2018-12-19 ENCOUNTER — Ambulatory Visit (INDEPENDENT_AMBULATORY_CARE_PROVIDER_SITE_OTHER): Admitting: Family Medicine

## 2018-12-19 ENCOUNTER — Encounter: Payer: Self-pay | Admitting: Family Medicine

## 2018-12-19 VITALS — Wt 185.0 lb

## 2018-12-19 DIAGNOSIS — R112 Nausea with vomiting, unspecified: Secondary | ICD-10-CM

## 2018-12-19 MED ORDER — ONDANSETRON HCL 4 MG PO TABS: 4.0000 mg | ORAL_TABLET | Freq: Three times a day (TID) | ORAL | 2 refills | Status: DC | PRN

## 2018-12-19 NOTE — Patient Instructions (Signed)
Thank you for coming in today. Use Zofran as needed for vomiting. Return to work on feeling better.  If having other symptoms such as cough shortness of breath or loss of sense of smell will need to do COVID testing however your current symptoms are unlikely to be COVID-19 related.  Recheck if not improving.   If your belly pain worsens, or you have high fever, bad vomiting, blood in your stool or black tarry stool go to the Emergency Room.    Vomiting, Adult Vomiting occurs when stomach contents are thrown up and out of the mouth. Many people notice nausea before vomiting. Vomiting can make you feel weak and cause you to become dehydrated. Dehydration can make you feel tired and thirsty, cause you to have a dry mouth, and decrease how often you urinate. Older adults and people who have other diseases or a weak body defense system (immune system) are at higher risk for dehydration. It is important to treat vomiting as told by your health care provider. Follow these instructions at home:  Eating and drinking     Follow these recommendations as told by your health care provider:  Take an oral rehydration solution (ORS). This is a drink that is sold at pharmacies and retail stores.  Eat bland, easy-to-digest foods in small amounts as you are able. These foods include bananas, applesauce, rice, lean meats, toast, and crackers.  Drink clear fluids slowly and in small amounts as you are able. Clear fluids include water, ice chips, low-calorie sports drinks, and fruit juice that has water added (diluted fruit juice).  Avoid drinking fluids that contain a lot of sugar or caffeine, such as energy drinks, sports drinks, and soda.  Avoid alcohol.  Avoid spicy or fatty foods.  General instructions  Wash your hands often using soap and water. If soap and water are not available, use hand sanitizer. Make sure that everyone in your household washes their hands frequently.  Take over-the-counter  and prescription medicines only as told by your health care provider.  Rest at home while you recover.  Watch your condition for any changes.  Keep all follow-up visits as told by your health care provider. This is important. Contact a health care provider if:  Your vomiting gets worse.  You have new symptoms.  You have a fever.  You cannot drink fluids without vomiting.  You feel light-headed or dizzy.  You have a headache.  You have muscle cramps.  You have a rash.  You have pain while urinating. Get help right away if:  You have pain in your chest, neck, arm, or jaw.  You feel extremely weak or you faint.  You have persistent vomiting.  You have vomit that is bright red or looks like black coffee grounds.  You have stools that are bloody or black, or stools that look like tar.  You have a severe headache, a stiff neck, or both.  You have severe pain, cramping, or bloating in your abdomen.  You have trouble breathing or you are breathing very quickly.  Your heart is beating very quickly.  Your skin feels cold and clammy.  You feel confused.  You have signs of dehydration, such as: ? Dark urine, very little urine, or no urine. ? Cracked lips. ? Dry mouth. ? Sunken eyes. ? Sleepiness. ? Weakness. These symptoms may represent a serious problem that is an emergency. Do not wait to see if the symptoms will go away. Get medical help right away. Call  your local emergency services (911 in the [place] Do not drive yourself to the hospital. Summary  Vomiting occurs when stomach contents are thrown up and out of the mouth. Vomiting can cause you to become dehydrated. Older adults and people who have other diseases or a weak immune system are at higher risk for dehydration.  It is important to treat vomiting as told by your health care provider. Follow your health care provider's instructions about eating and drinking.  Wash your hands often using soap and water.  If soap and water are not available, use hand sanitizer. Make sure that everyone in your household washes their hands frequently.  Watch your condition for any changes and for signs of dehydration.  Keep all follow-up visits as told by your health care provider. This is important. This information is not intended to replace advice given to you by your health care provider. Make sure you discuss any questions you have with your health care provider. Document Released: 04/17/2015 Document Revised: 08/29/2017 Document Reviewed: 08/29/2017 Elsevier Patient Education  2020 [employer].
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
## 2018-12-31 ENCOUNTER — Ambulatory Visit (INDEPENDENT_AMBULATORY_CARE_PROVIDER_SITE_OTHER): Admitting: Licensed Clinical Social Worker

## 2018-12-31 DIAGNOSIS — F602 Antisocial personality disorder: Secondary | ICD-10-CM

## 2018-12-31 NOTE — Progress Notes (Signed)
PT did not show for in person appointment. He was contacted 24 hours business days before and confirmed he would be at appointment.

## 2019-02-06 ENCOUNTER — Telehealth: Payer: Self-pay | Admitting: *Deleted

## 2019-02-06 ENCOUNTER — Ambulatory Visit: Admitting: Diagnostic Neuroimaging

## 2019-02-06 NOTE — Telephone Encounter (Signed)
Patient was no show for new patient appointment today. 

## 2019-02-07 ENCOUNTER — Encounter: Payer: Self-pay | Admitting: Diagnostic Neuroimaging

## 2019-02-11 DIAGNOSIS — F1123 Opioid dependence with withdrawal: Secondary | ICD-10-CM | POA: Insufficient documentation

## 2019-02-11 DIAGNOSIS — F17200 Nicotine dependence, unspecified, uncomplicated: Secondary | ICD-10-CM | POA: Insufficient documentation

## 2019-02-11 DIAGNOSIS — F1124 Opioid dependence with opioid-induced mood disorder: Secondary | ICD-10-CM | POA: Insufficient documentation

## 2019-02-13 ENCOUNTER — Other Ambulatory Visit: Payer: Self-pay | Admitting: Physician Assistant

## 2019-02-13 DIAGNOSIS — R4586 Emotional lability: Secondary | ICD-10-CM

## 2019-02-13 DIAGNOSIS — F419 Anxiety disorder, unspecified: Secondary | ICD-10-CM

## 2019-02-13 DIAGNOSIS — R413 Other amnesia: Secondary | ICD-10-CM

## 2019-02-13 DIAGNOSIS — F430 Acute stress reaction: Secondary | ICD-10-CM

## 2019-02-25 ENCOUNTER — Ambulatory Visit (HOSPITAL_COMMUNITY): Admitting: Licensed Clinical Social Worker

## 2019-03-05 ENCOUNTER — Other Ambulatory Visit: Payer: Self-pay

## 2019-03-05 ENCOUNTER — Ambulatory Visit (INDEPENDENT_AMBULATORY_CARE_PROVIDER_SITE_OTHER): Admitting: Physician Assistant

## 2019-03-05 ENCOUNTER — Encounter: Payer: Self-pay | Admitting: Physician Assistant

## 2019-03-05 VITALS — BP 137/91 | HR 100 | Ht 72.0 in | Wt 194.0 lb

## 2019-03-05 DIAGNOSIS — R4586 Emotional lability: Secondary | ICD-10-CM

## 2019-03-05 DIAGNOSIS — F430 Acute stress reaction: Secondary | ICD-10-CM

## 2019-03-05 DIAGNOSIS — I10 Essential (primary) hypertension: Secondary | ICD-10-CM

## 2019-03-05 DIAGNOSIS — F339 Major depressive disorder, recurrent, unspecified: Secondary | ICD-10-CM | POA: Diagnosis not present

## 2019-03-05 DIAGNOSIS — F419 Anxiety disorder, unspecified: Secondary | ICD-10-CM | POA: Diagnosis not present

## 2019-03-05 DIAGNOSIS — F1910 Other psychoactive substance abuse, uncomplicated: Secondary | ICD-10-CM

## 2019-03-05 DIAGNOSIS — R413 Other amnesia: Secondary | ICD-10-CM

## 2019-03-05 DIAGNOSIS — F900 Attention-deficit hyperactivity disorder, predominantly inattentive type: Secondary | ICD-10-CM

## 2019-03-05 DIAGNOSIS — F411 Generalized anxiety disorder: Secondary | ICD-10-CM

## 2019-03-05 MED ORDER — CLONIDINE HCL 0.2 MG PO TABS: 0.2000 mg | ORAL_TABLET | Freq: Two times a day (BID) | ORAL | 2 refills | Status: DC

## 2019-03-05 MED ORDER — ONDANSETRON HCL 4 MG PO TABS: 4.0000 mg | ORAL_TABLET | Freq: Three times a day (TID) | ORAL | 2 refills | Status: DC | PRN

## 2019-03-05 MED ORDER — CITALOPRAM HYDROBROMIDE 40 MG PO TABS: 40.0000 mg | ORAL_TABLET | Freq: Every day | ORAL | 1 refills | Status: DC

## 2019-03-05 MED ORDER — LAMOTRIGINE 25 MG PO TABS: ORAL_TABLET | ORAL | 2 refills | Status: DC

## 2019-03-05 NOTE — Progress Notes (Signed)
Subjective:    Patient ID: Cole [name], male    DOB: 27-Jun-1985, 33 y.o.   MRN: [id number]  HPI  Patient is a 33 year old male with a long past medical history of drug abuse, anxiety, depression, mood changes, suicidal ideation who presents to the clinic for follow-up after last hospital visit on 02/11/2023 alcohol use disorder/suicidal ideation/heroin use.  Until the past year patient was in remission from substance abuse.  He has relapsed due to some acute stress in his marriage and other life circumstances.  He is talking with any counselor regularly.  He does not have a psychiatrist.  I did make a referral for psychiatrist back in September however patient did not return their call.  Patient is currently on Celexa.  He asked about starting Adderall back.  Patient denies any suicidal or homicidal ideations.  Patient denies any recent drug use.  He does need a refill of clonidine.  He was on this for his blood pressure.  He denies any chest pains, palpitations, shortness of breath, headaches.  .. Active Ambulatory Problems    Diagnosis Date Noted  . ALCOHOL WITHDRAWAL 08/04/2009  . DRUG ABUSE, IN REMISSION 08/25/2009  . DEPRESSIVE DISORDER NOT ELSEWHERE CLASSIFIED 11/17/2008  . MIGRAINE, CLASSICAL, INTRACTABLE 03/12/2009  . SINUS TACHYCARDIA 06/29/2009  . 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 DISC DISEASE, LUMBAR 01/14/2009  . LOW BACK PAIN, CHRONIC 11/17/2008  . SYNCOPE 07/27/2009  . NAUSEA WITH VOMITING 03/12/2009  . ELEVATED BLOOD PRESSURE WITHOUT DIAGNOSIS OF HYPERTENSION 02/10/2009  . ADHD (attention deficit hyperactivity disorder) 10/11/2012  . History of narcotic addiction (HCC) 10/29/2012  . Attention deficit hyperactivity disorder (ADHD), 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  . Tachycardia 12/18/2014  . Depression, recurrent (HCC) 12/26/2016  . GAD (generalized anxiety disorder) 12/26/2016  . History of allergic reaction 02/06/2017  . Angioedema  10/16/2018  . Allergic reaction/recurrent urticaria 10/16/2018  . 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 Recurrent urticaria 11/20/2018  . Chronic rhinitis 11/20/2018  . Transient amnesia 12/17/2018  . Anxiety 12/17/2018  . Acute stress reaction 12/17/2018  . Essential hypertension 03/06/2019  . Drug abuse (HCC) 03/06/2019   Resolved Ambulatory Problems    Diagnosis Date Noted  . NUMBNESS 12/15/2008  . Dysuria 01/06/2009  . FRACTURE, FOOT 12/09/2008  . Burn of unspecified degree of forearm 01/26/2009  . Toe injury 07/18/2014  . Tinea pedis of both feet 07/18/2014  . Left elbow pain 01/20/2017   Past Medical History:  Diagnosis Date  . Alcoholism (HCC)   . Chronic pain syndrome   . DDD (degenerative disc disease)   . Depression   . History of ADHD   . Hyperlipidemia   . Hypertension        Review of Systems See HPI.     Objective:   Physical Exam Vitals signs reviewed.  Constitutional:      Appearance: Normal appearance.  HENT:     Head: Normocephalic.  Cardiovascular:     Rate and Rhythm: Normal rate and regular rhythm.  Pulmonary:     Effort: Pulmonary effort is normal.     Breath sounds: Normal breath sounds.  Skin:    General: Skin is warm.  Neurological:     General: No focal deficit present.     Mental Status: He is alert and oriented to person, place, and time.  Psychiatric:        Mood and Affect: Mood normal.  Behavior: Behavior normal.    .. [facility] 03/05/2019 12/14/2018 11/09/2018 03/07/2017 12/26/2016  Decreased Interest 1 2 1 2 2   Down, Depressed, Hopeless 1 2 2 1 2   PHQ - 2 Score 2 4 3 3 4   Altered sleeping 2 2 1  0 0  Tired, decreased energy 2 2 1 1 2   Change in appetite 2 3 3 2 3   Feeling bad or failure about yourself  2 3 2 1 3   Trouble concentrating 3 2 2 1 2   Moving slowly or fidgety/restless 1 1 1 1 1   Suicidal thoughts 1 0 1 0 0  PHQ-9 Score 15 17  14 9 15   Difficult doing work/chores Very difficult Very difficult Very difficult Not difficult at all -   .. GAD 7 : Generalized Anxiety Score 03/05/2019 12/14/2018 11/09/2018 03/07/2017  Nervous, Anxious, on Edge 1 1 1 1   Control/stop worrying 1 3 3 1   Worry too much - different things 2 3 2 1   Trouble relaxing 2 2 2 1   Restless 2 1 1 1   Easily annoyed or irritable 3 3 3 1   Afraid - awful might happen 1 1 1  0  Total GAD 7 Score 12 14 13 6   Anxiety Difficulty Very difficult Very difficult Very difficult -           Assessment & Plan:  Marland Kitchen.Marland Kitchen.Cole [name] was seen today for follow-up.  Diagnoses and all orders for this visit:  Depression, recurrent (HCC) -     citalopram (CELEXA) 40 MG tablet; Take 1 tablet (40 mg total) by mouth daily. -     lamoTRIgine (LAMICTAL) 25 MG tablet; Take 2 tablets daily.  Transient amnesia -     citalopram (CELEXA) 40 MG tablet; Take 1 tablet (40 mg total) by mouth daily. -     lamoTRIgine (LAMICTAL) 25 MG tablet; Take 2 tablets daily.  Anxiety -     citalopram (CELEXA) 40 MG tablet; Take 1 tablet (40 mg total) by mouth daily. -     lamoTRIgine (LAMICTAL) 25 MG tablet; Take 2 tablets daily.  Acute stress reaction -     citalopram (CELEXA) 40 MG tablet; Take 1 tablet (40 mg total) by mouth daily. -     lamoTRIgine (LAMICTAL) 25 MG tablet; Take 2 tablets daily.  Mood changes -     citalopram (CELEXA) 40 MG tablet; Take 1 tablet (40 mg total) by mouth daily. -     lamoTRIgine (LAMICTAL) 25 MG tablet; Take 2 tablets daily.  Attention deficit hyperactivity disorder (ADHD), predominantly inattentive type  GAD (generalized anxiety disorder) -     citalopram (CELEXA) 40 MG tablet; Take 1 tablet (40 mg total) by mouth daily. -     lamoTRIgine (LAMICTAL) 25 MG tablet; Take 2 tablets daily.  Essential hypertension -     cloNIDine (CATAPRES) 0.2 MG tablet; Take 1 tablet (0.2 mg total) by mouth 2 (two) times daily.  Drug abuse (HCC)  Other orders -      ondansetron (ZOFRAN) 4 MG tablet; Take 1-2 tablets (4-8 mg total) by mouth every 8 (eight) hours as needed for nausea or vomiting.   Patient certainly needs to go to behavioral health.  Referral was made in September but patient did not return call.  I urged him to make this phone call and get an appointment.  I did refill his Celexa, Lamictal, clonidine.  I do think there could be some better medication options for him.  He is going  to counseling regularly.  I do think he wants help.  I do think he would benefit from even an inpatient drug facility.  I am not sure of these resources and he is concerned about cost.  Number was given for him to call to make an appointment.  Blood pressure is only mildly elevated today.  Patient denies any active suicidal or homicidal thoughts.can discussed adderall with BH.

## 2019-03-05 NOTE — Patient Instructions (Signed)
[place] [phone]

## 2019-03-06 ENCOUNTER — Encounter: Payer: Self-pay | Admitting: Physician Assistant

## 2019-03-06 DIAGNOSIS — F1910 Other psychoactive substance abuse, uncomplicated: Secondary | ICD-10-CM | POA: Insufficient documentation

## 2019-03-06 DIAGNOSIS — I10 Essential (primary) hypertension: Secondary | ICD-10-CM | POA: Insufficient documentation

## 2019-04-02 ENCOUNTER — Encounter: Payer: Self-pay | Admitting: Physician Assistant

## 2019-04-02 ENCOUNTER — Other Ambulatory Visit: Payer: Self-pay

## 2019-04-02 ENCOUNTER — Ambulatory Visit (INDEPENDENT_AMBULATORY_CARE_PROVIDER_SITE_OTHER): Admitting: Physician Assistant

## 2019-04-02 VITALS — BP 113/71 | HR 88 | Ht 73.0 in | Wt 207.0 lb

## 2019-04-02 DIAGNOSIS — F1910 Other psychoactive substance abuse, uncomplicated: Secondary | ICD-10-CM

## 2019-04-02 DIAGNOSIS — F339 Major depressive disorder, recurrent, unspecified: Secondary | ICD-10-CM | POA: Diagnosis not present

## 2019-04-02 DIAGNOSIS — M5440 Lumbago with sciatica, unspecified side: Secondary | ICD-10-CM | POA: Diagnosis not present

## 2019-04-02 DIAGNOSIS — F411 Generalized anxiety disorder: Secondary | ICD-10-CM

## 2019-04-02 DIAGNOSIS — F900 Attention-deficit hyperactivity disorder, predominantly inattentive type: Secondary | ICD-10-CM

## 2019-04-02 DIAGNOSIS — F1010 Alcohol abuse, uncomplicated: Secondary | ICD-10-CM

## 2019-04-02 DIAGNOSIS — G8929 Other chronic pain: Secondary | ICD-10-CM

## 2019-04-02 DIAGNOSIS — Z79899 Other long term (current) drug therapy: Secondary | ICD-10-CM

## 2019-04-02 MED ORDER — VORTIOXETINE HBR 10 MG PO TABS: 10.0000 mg | ORAL_TABLET | Freq: Every day | ORAL | 2 refills | Status: DC

## 2019-04-02 NOTE — Patient Instructions (Addendum)
Decrease clonidine on once a night.   1/2 tablet of celexa and trintellix for 7 days then stop celexa and start trintellix full tablet.  Only use hydroxyzine as needed.

## 2019-04-02 NOTE — Progress Notes (Signed)
Subjective:    Patient ID: Cole [name], male    DOB: 1985/12/30, 33 y.o.   MRN: [id number]  HPI  Pt is a 33 yo male with MDD, GAD, ADHD, and chronic back pain that has led to opoid dependence and alcohol abuse. He most recently has had a lot of depression and anxiety. He has had multiple ED visits for substance abuse, sucidical thoughts. He did enter a 30 day outpatient treatment center after relapse. He has been sober since. His next [facility] appt is Jan 18th. He is going to AA/NA meetings once a week. He is hoping to get into an outpatient treatment center 3 days a aweek fro 3 hours a day. He denies any SI/HC. He feels like medication makes him way to sleepy. He has no motivation to do anything. He just wants to lay around and sleep. He comes in wanting a medication adjustment and to consider going back on adderall for ADHd.   .. Active Ambulatory Problems    Diagnosis Date Noted  . ALCOHOL WITHDRAWAL 08/04/2009  . DRUG ABUSE, IN REMISSION 08/25/2009  . DEPRESSIVE DISORDER NOT ELSEWHERE CLASSIFIED 11/17/2008  . MIGRAINE, CLASSICAL, INTRACTABLE 03/12/2009  . SINUS TACHYCARDIA 06/29/2009  . 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 DISC DISEASE, LUMBAR 01/14/2009  . LOW BACK PAIN, CHRONIC 11/17/2008  . SYNCOPE 07/27/2009  . NAUSEA WITH VOMITING 03/12/2009  . ELEVATED BLOOD PRESSURE WITHOUT DIAGNOSIS OF HYPERTENSION 02/10/2009  . ADHD (attention deficit hyperactivity disorder) 10/11/2012  . History of narcotic addiction (HCC) 10/29/2012  . Attention deficit hyperactivity disorder (ADHD), 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  . Tachycardia 12/18/2014  . Depression, recurrent (HCC) 12/26/2016  . GAD (generalized anxiety disorder) 12/26/2016  . History of allergic reaction 02/06/2017  . Angioedema 10/16/2018  . Allergic reaction/recurrent urticaria 10/16/2018  . 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  Recurrent urticaria 11/20/2018  . Chronic rhinitis 11/20/2018  . Transient amnesia 12/17/2018  . Anxiety 12/17/2018  . Acute stress reaction 12/17/2018  . Essential hypertension 03/06/2019  . Drug abuse (HCC) 03/06/2019  . Alcohol abuse 04/08/2019   Resolved Ambulatory Problems    Diagnosis Date Noted  . NUMBNESS 12/15/2008  . Dysuria 01/06/2009  . FRACTURE, FOOT 12/09/2008  . Burn of unspecified degree of forearm 01/26/2009  . Toe injury 07/18/2014  . Tinea pedis of both feet 07/18/2014  . Left elbow pain 01/20/2017   Past Medical History:  Diagnosis Date  . Alcoholism (HCC)   . Chronic pain syndrome   . DDD (degenerative disc disease)   . Depression   . History of ADHD   . Hyperlipidemia   . Hypertension     Review of Systems  All other systems reviewed and are negative.      Objective:   Physical Exam Vitals reviewed.  Constitutional:      Appearance: Normal appearance.  Cardiovascular:     Rate and Rhythm: Normal rate and regular rhythm.     Pulses: Normal pulses.  Pulmonary:     Effort: Pulmonary effort is normal.  Neurological:     General: No focal deficit present.     Mental Status: He is alert and oriented to person, place, and time.  Psychiatric:     Comments: Flat affect.     .. Depression screen [facility] 2/9 04/02/2019 03/05/2019 12/14/2018 11/09/2018 03/07/2017  Decreased Interest 2 1 2 1 2   Down, Depressed, Hopeless 3 1 2 2 1   PHQ - 2  Score 5 2 4 3 3   Altered sleeping - 2 2 1  0  Tired, decreased energy 2 2 2 1 1   Change in appetite 2 2 3 3 2   Feeling bad or failure about yourself  3 2 3 2 1   Trouble concentrating 1 3 2 2 1   Moving slowly or fidgety/restless 1 1 1 1 1   Suicidal thoughts 1 1 0 1 0  PHQ-9 Score - 15 17 14 9   Difficult doing work/chores Very difficult Very difficult Very difficult Very difficult Not difficult at all   .Marland Kitchen GAD 7 : Generalized Anxiety Score 04/02/2019 03/05/2019 12/14/2018 11/09/2018  Nervous, Anxious, on Edge 2 1 1 1    Control/stop worrying 2 1 3 3   Worry too much - different things 2 2 3 2   Trouble relaxing 1 2 2 2   Restless 1 2 1 1   Easily annoyed or irritable 3 3 3 3   Afraid - awful might happen 1 1 1 1   Total GAD 7 Score 12 12 14 13   Anxiety Difficulty Very difficult Very difficult Very difficult Very difficult           Assessment & Plan:   Marland KitchenMarland KitchenKepler was seen today for medication management.  Diagnoses and all orders for this visit:  Depression, recurrent ([place]) -     vortioxetine HBr (TRINTELLIX) 10 MG TABS tablet; Take 1 tablet (10 mg total) by mouth daily.  Attention deficit hyperactivity disorder (ADHD), predominantly inattentive type  GAD (generalized anxiety disorder)  Chronic bilateral low back pain with sciatica, sciatica laterality unspecified  Drug abuse ([place])  Alcohol abuse  Medication management   It will be at least a month before can get in with [facility]. Pt is very sedated and sleepy. I do not want to add controlled substance, adderall at this time. I will defer to [facility] for management. I do think would give him some energy but with hx of abuse of alcohol and narcotics there is some concern. Decreased clonadine to just at night. Perhaps that will make his sedation less during the day. Watch hydroyzine use during day due to sleepiness. Taper off celexa and titrate up on trinltellix for depression. Continue on suboxone. Continue with BH. Pt denies any SI/HC today. Follow up in 1 month.   Marland Kitchen.Spent 30 minutes with patient and greater than 50 percent of visit spent counseling patient regarding treatment plan.

## 2019-04-08 ENCOUNTER — Encounter: Payer: Self-pay | Admitting: Physician Assistant

## 2019-04-08 DIAGNOSIS — F1010 Alcohol abuse, uncomplicated: Secondary | ICD-10-CM | POA: Insufficient documentation

## 2019-05-03 IMAGING — MR MR ELBOW*L* W/O CM
5 series · 37 of 40 positions shown · non-contrast
Comparison: None.

CLINICAL DATA: Medial epicondylitis. Pulling injury. Stiffness and
weakness.

EXAM:
MRI OF THE LEFT ELBOW WITHOUT CONTRAST
TECHNIQUE: Multiplanar, multisequence MR imaging of the elbow was performed. No
intravenous contrast was administered.

[Series 3: T1 · axial · 4.0mm · 0.55mm/px · z∈[-85,+63]mm · 8 of 35 slices shown]
[im 1/35]
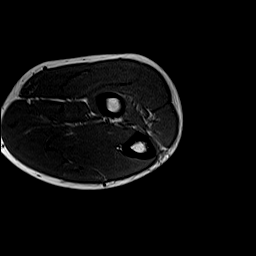
[im 5/35]
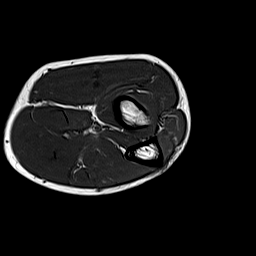
[im 10/35]
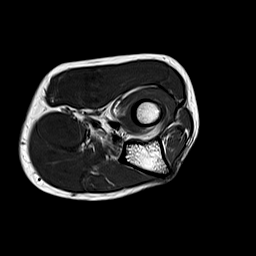
[im 15/35]
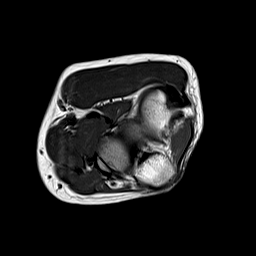
[im 20/35]
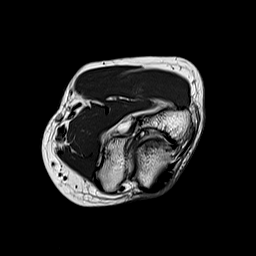
[im 25/35]
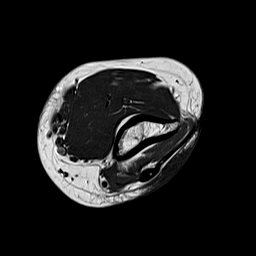
[im 30/35]
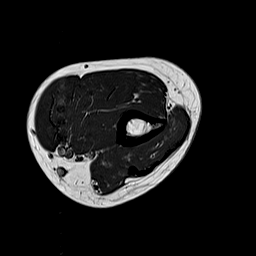
[im 35/35]
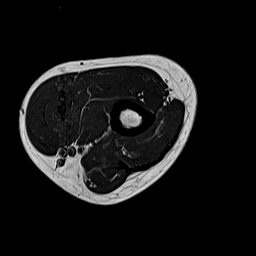

[Series 4: T2 fat-sat · axial · 4.0mm · 0.55mm/px · z∈[-85,+63]mm · 8 of 35 slices shown (1 of 2)]
[im 1/35]
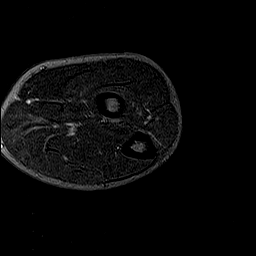
[im 5/35]
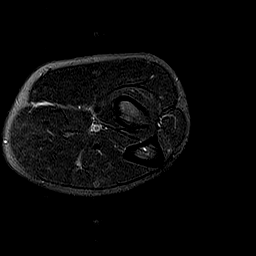
[im 10/35]
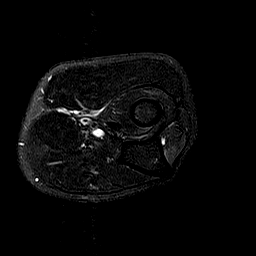
[im 15/35]
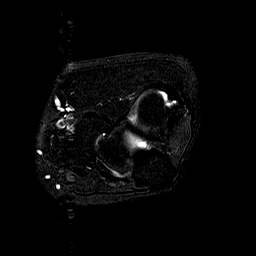
[im 20/35]
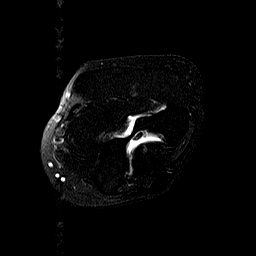
[im 25/35]
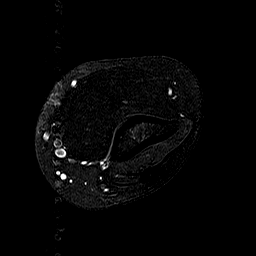
[im 30/35]
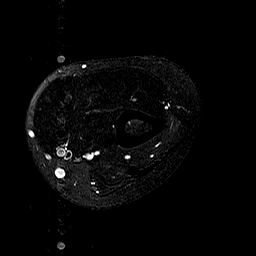
[im 35/35]
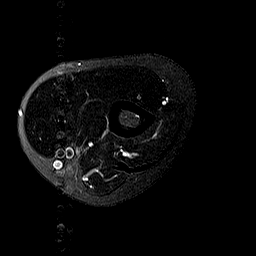

[Series 5: T2 fat-sat · coronal · 3.0mm · 0.59mm/px · 8 of 31 slices shown (2 of 2)]
[im 1/31]
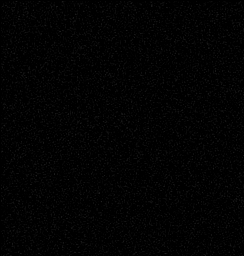
[im 5/31]
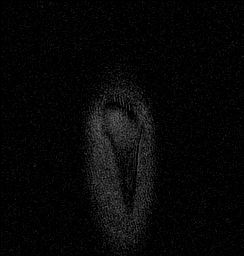
[im 9/31]
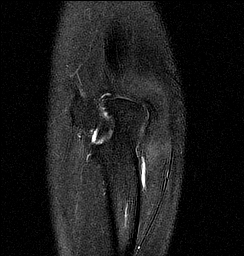
[im 13/31]
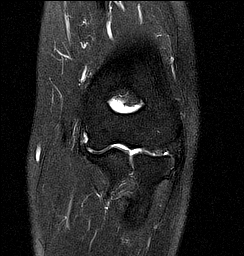
[im 18/31]
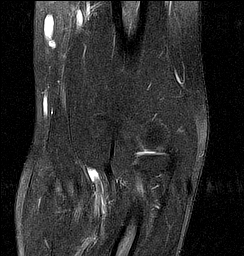
[im 22/31]
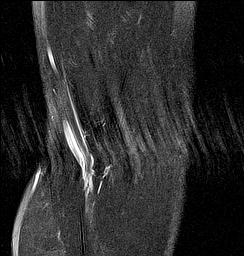
[im 26/31]
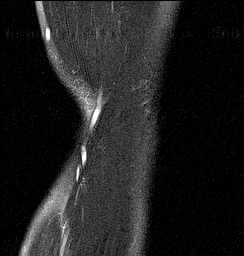
[im 31/31]
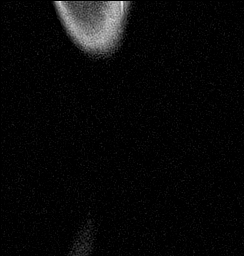

[Series 6: coronal ir if · coronal · 3.0mm · 0.31mm/px · 5 of 31 slices shown]
[im 1/31]
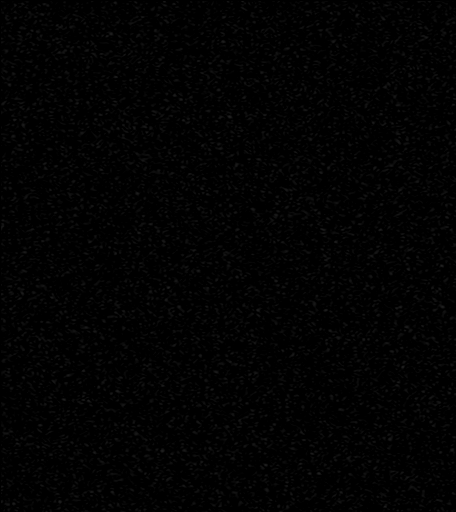
[im 5/31]
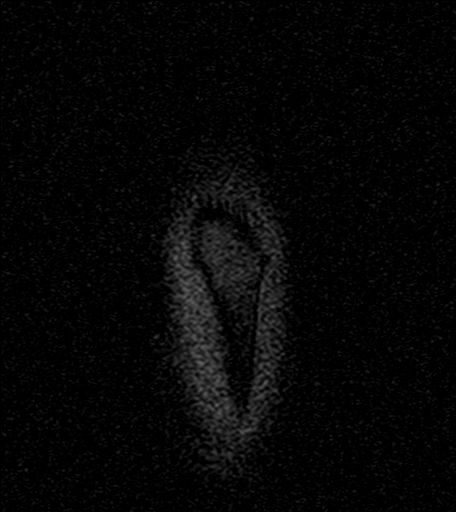
[im 9/31]
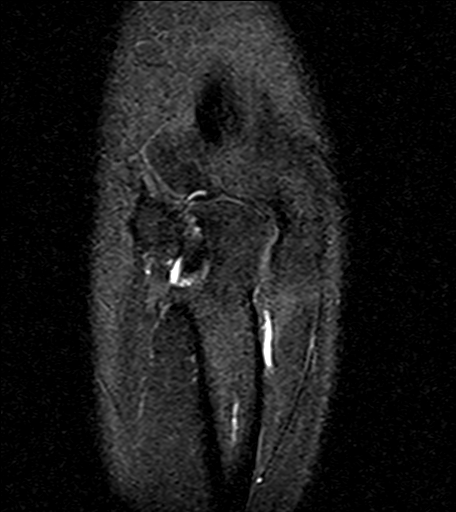
[im 13/31]
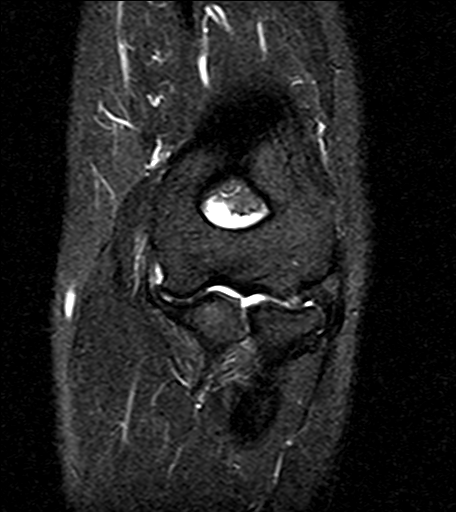
[im 18/31]
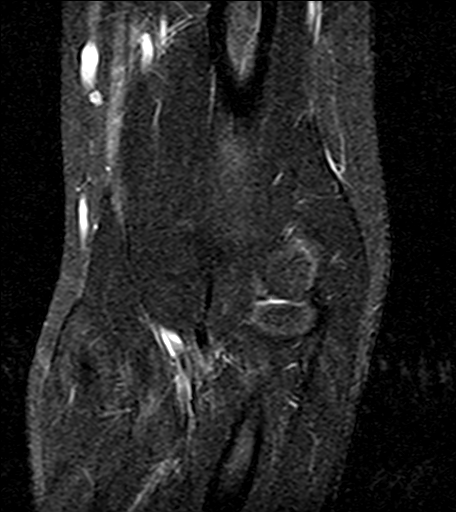

[Series 7: PD fat-sat · oblique · 3.0mm · 0.31mm/px · 8 of 33 slices shown]
[im 1/33]
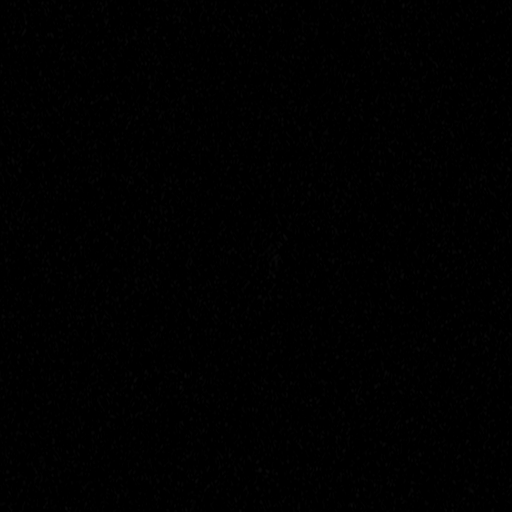
[im 5/33]
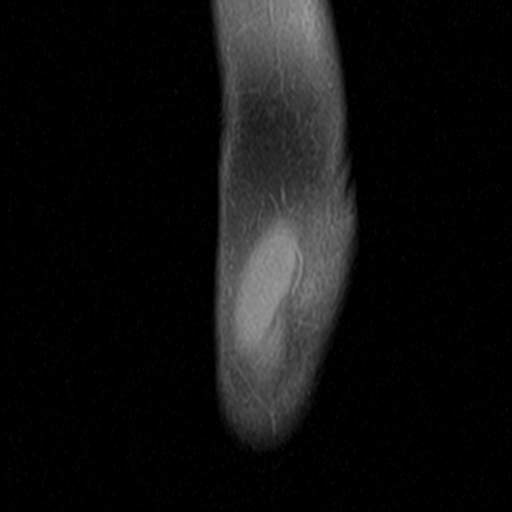
[im 10/33]
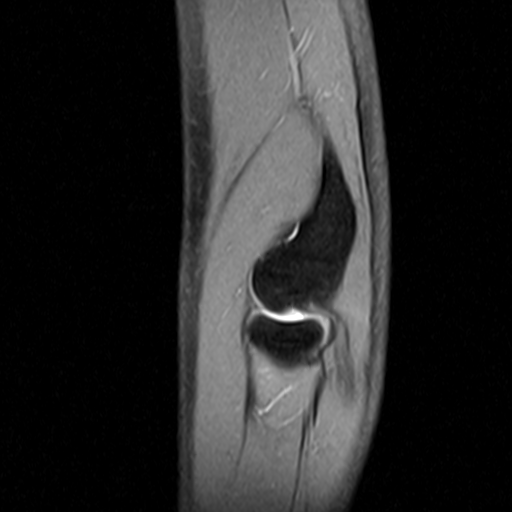
[im 14/33]
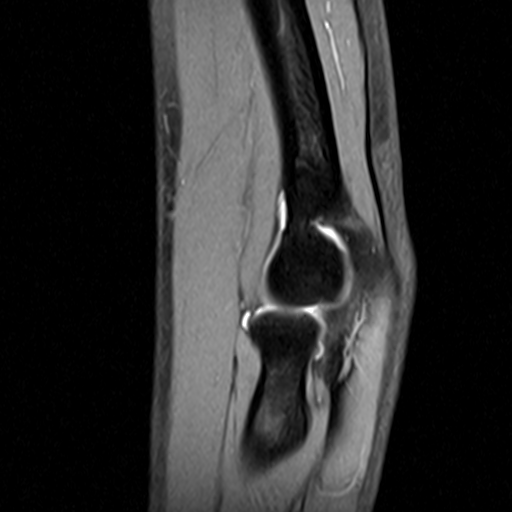
[im 19/33]
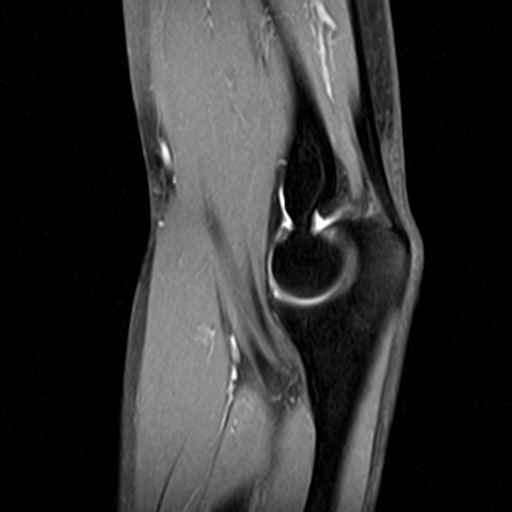
[im 23/33]
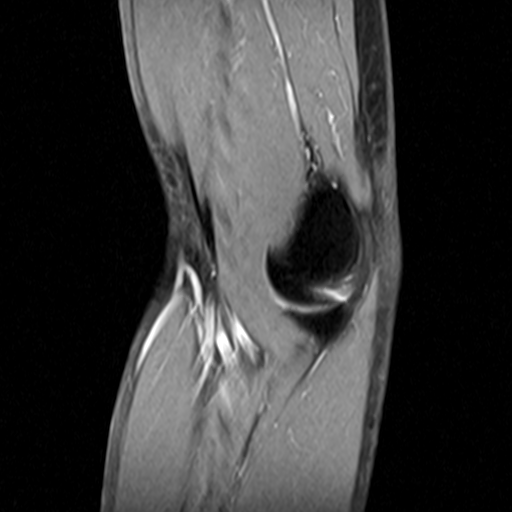
[im 28/33]
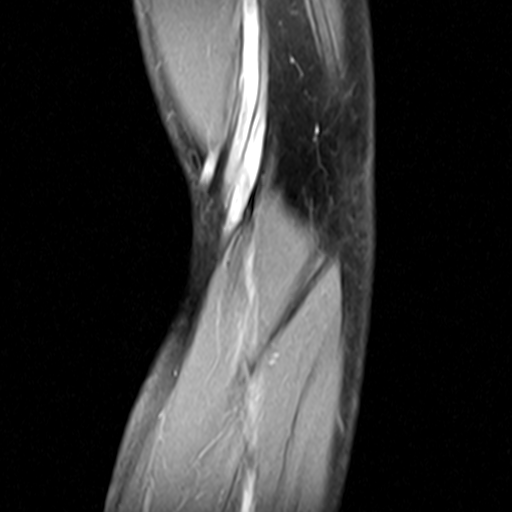
[im 33/33]
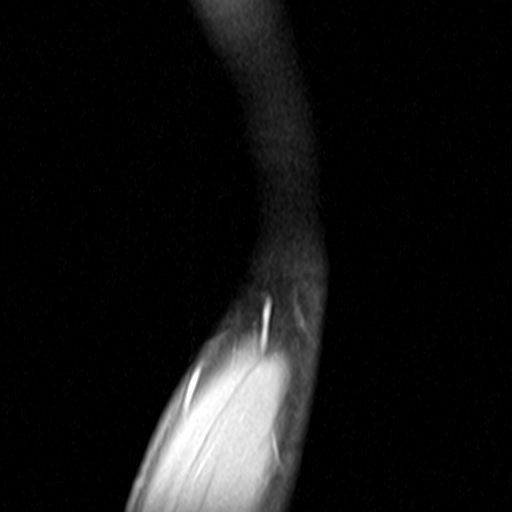

[37 of 40 positions shown; findings below may reference images not displayed]

FINDINGS: TENDONS

Common forearm flexor origin: Intact.

Common forearm extensor origin: Intact.

Biceps: Intact.

Triceps: Intact.

LIGAMENTS

Medial stabilizers: Intact.

Lateral stabilizers:  Intact.

Cartilage: No chondral defect.

Joint: No joint effusion. No intra-articular loose body. No
synovitis.

Cubital tunnel: Normal.

Bones: No focal marrow signal abnormality. No fracture or
dislocation.

Soft tissue: Normal muscle signal. No fluid collection or hematoma.
IMPRESSION: 1. No internal derangement of the left elbow.

## 2019-05-06 ENCOUNTER — Telehealth: Payer: Self-pay | Admitting: Physician Assistant

## 2019-05-06 ENCOUNTER — Encounter: Payer: Self-pay | Admitting: Physician Assistant

## 2019-05-06 ENCOUNTER — Ambulatory Visit (INDEPENDENT_AMBULATORY_CARE_PROVIDER_SITE_OTHER): Admitting: Physician Assistant

## 2019-05-06 ENCOUNTER — Other Ambulatory Visit: Payer: Self-pay

## 2019-05-06 VITALS — BP 140/90 | HR 85 | Ht 73.0 in | Wt 204.0 lb

## 2019-05-06 DIAGNOSIS — Z0283 Encounter for blood-alcohol and blood-drug test: Secondary | ICD-10-CM | POA: Diagnosis not present

## 2019-05-06 DIAGNOSIS — F1121 Opioid dependence, in remission: Secondary | ICD-10-CM

## 2019-05-06 DIAGNOSIS — R410 Disorientation, unspecified: Secondary | ICD-10-CM

## 2019-05-06 DIAGNOSIS — F1011 Alcohol abuse, in remission: Secondary | ICD-10-CM

## 2019-05-06 DIAGNOSIS — I10 Essential (primary) hypertension: Secondary | ICD-10-CM

## 2019-05-06 DIAGNOSIS — Z9114 Patient's other noncompliance with medication regimen: Secondary | ICD-10-CM

## 2019-05-06 NOTE — Telephone Encounter (Signed)
Left vm with Dr. Emmit Alexanders office. No one was available to speak with me. I left all the information and asked them to please reach out to the patient or to [place] directly if they have any follow up questions.

## 2019-05-06 NOTE — Progress Notes (Signed)
Subjective:    Patient ID: Cole [name], male    DOB: [birth year], 34 y.o.   MRN: [id number]  HPI  Pt is a 34 yo male with severe opioid use disorder, moderate alcohol use disorder, MDD who presents to the clinic with concerns "about how he was acting last night".   His wife was concerned because he acted like he had used again but he was with her all day long and denies any use of narocitics/other drugs/or alcohol. His behavior last night was confused, non verbal, altered mental status, disoriented, not able to communicate or function.I asked many times if he drank any alcohol he said "he did not think so" he "remembers putting beer in refrigerator but not drinking. He reports he does at times take more suboxone if the "needs it". His dose was recently decreased and he has not done well with the medication decrease. He finally admits he cannot get a refill for 2 weeks and only has 2 strips left. He at times will also double up on trazodone.    Dr. Brett [name] at [facility] pain management in [place] prescribes is suboxone.   .. Active Ambulatory Problems    Diagnosis Date Noted  . ALCOHOL WITHDRAWAL 08/04/2009  . DRUG ABUSE, IN REMISSION 08/25/2009  . DEPRESSIVE DISORDER NOT ELSEWHERE CLASSIFIED 11/17/2008  . MIGRAINE, CLASSICAL, INTRACTABLE 03/12/2009  . SINUS TACHYCARDIA 06/29/2009  . 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 [place] DISEASE, LUMBAR 01/14/2009  . LOW BACK PAIN, CHRONIC 11/17/2008  . SYNCOPE 07/27/2009  . NAUSEA WITH VOMITING 03/12/2009  . ELEVATED BLOOD PRESSURE WITHOUT DIAGNOSIS OF HYPERTENSION 02/10/2009  . ADHD (attention deficit hyperactivity disorder) 10/11/2012  . History of narcotic addiction ([place]) 10/29/2012  . Attention deficit hyperactivity disorder (ADHD), 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  . Tachycardia 12/18/2014  . Depression, recurrent ([place]) 12/26/2016  . GAD (generalized anxiety disorder) 12/26/2016  . History of allergic reaction  02/06/2017  . Angioedema 10/16/2018  . Allergic reaction/recurrent urticaria 10/16/2018  . Severe episode of recurrent major depressive disorder, without psychotic features ([place]) 10/16/2018  . Recurrent urticaria 11/20/2018  . Chronic rhinitis 11/20/2018  . Transient amnesia 12/17/2018  . Anxiety 12/17/2018  . Acute stress reaction 12/17/2018  . Essential hypertension 03/06/2019  . Drug abuse ([place]) 03/06/2019  . Alcohol abuse 04/08/2019  . Disorientation 05/07/2019  . Noncompliance with medication treatment due to overuse of medication 05/07/2019  . History of alcohol abuse 05/07/2019   Resolved Ambulatory Problems    Diagnosis Date Noted  . NUMBNESS 12/15/2008  . Dysuria 01/06/2009  . FRACTURE, FOOT 12/09/2008  . Burn of unspecified degree of forearm 01/26/2009  . Toe injury 07/18/2014  . Tinea pedis of both feet 07/18/2014  . Left elbow pain 01/20/2017   Past Medical History:  Diagnosis Date  . Alcoholism ([place])   . Chronic pain syndrome   . DDD (degenerative disc disease)   . Depression   . History of ADHD   . Hyperlipidemia   . Hypertension      Review of Systems See HPI.     Objective:   Physical Exam Vitals reviewed.  Constitutional:      Appearance: Normal appearance.  Cardiovascular:     Rate and Rhythm: Normal rate and regular rhythm.     Pulses: Normal pulses.  Pulmonary:     Effort: Pulmonary effort is normal.  Neurological:     General: No focal deficit present.     Mental Status: He is alert and oriented to person,  place, and time.     Motor: No weakness.     Gait: Gait normal.  Psychiatric:     Comments: Very flat affect.            Assessment & Plan:  Marland KitchenMarland KitchenPritesh was seen today for follow-up.  Diagnoses and all orders for this visit:  Disorientation -     TSH -     COMPLETE METABOLIC PANEL WITH GFR -     CBC with Differential/Platelet -     Lamotrigine level  Encounter for drug screening -     Pain Mgmt, Profile 6 Conf w/o  mM, U  History of narcotic addiction (HCC)  History of alcohol abuse  Noncompliance with medication treatment due to overuse of medication  Essential hypertension   I ended up speaking with wife on phone and she sent videos of him completely disoriented. Pt really has no memory of this. She is not aware of any using. She is aware that he is not taking his prescribed medication appropriately. Encouraged her to take a more active role in suboxone admistration. Wife is concerned "something else" is going on.   I will do basic labs and check lamictal level. UDS done today.   Pt is clearly taking too much suboxone daily. Discussed he must be compliant because my fear is that in the next 2 weeks he will relapse or use because he does not have medication. Called Dr. Valentino [name] office and left msg of concerns with him running out of medication. Clearly he MUST be compliant but I do not want him to relapse. I think it is likely he could have drank alcohol and not be aware yesterday and perhaps that was stupor. UDS will give [place] more information on that. Continue 3 times a week counseling. If he does start going through withdrawal go to ED to avoid relapse for close monitoring.   Elevated BP. 2nd recheck came down in office. Will continue to monitor.   Time spent with patient: 40 minutes.

## 2019-05-06 NOTE — Telephone Encounter (Signed)
Thank you :)

## 2019-05-07 ENCOUNTER — Telehealth: Payer: Self-pay

## 2019-05-07 DIAGNOSIS — R410 Disorientation, unspecified: Secondary | ICD-10-CM | POA: Insufficient documentation

## 2019-05-07 DIAGNOSIS — F1011 Alcohol abuse, in remission: Secondary | ICD-10-CM | POA: Insufficient documentation

## 2019-05-07 DIAGNOSIS — Z9114 Patient's other noncompliance with medication regimen: Secondary | ICD-10-CM | POA: Insufficient documentation

## 2019-05-07 NOTE — Progress Notes (Signed)
Adrain,   Normal thyroid. Normal electrolytes. Normal CBC.   Some labs and UDS pending.

## 2019-05-07 NOTE — Telephone Encounter (Signed)
Tiffany, nurse from [facility], called and wanted to speak with Lesly Rubenstein about referral. Wanting to know a little more information and detail about patient. Tiffany's call back is [phone].  Please call when you get a chance.   Thanks!

## 2019-05-08 LAB — CBC WITH DIFFERENTIAL/PLATELET
Absolute Monocytes: 668 {cells}/uL (ref 200–950)
Basophils Absolute: 71 {cells}/uL (ref 0–200)
Basophils Relative: 0.8 %
Eosinophils Absolute: 312 {cells}/uL (ref 15–500)
Eosinophils Relative: 3.5 %
HCT: 46.1 % (ref 38.5–50.0)
Hemoglobin: 15.6 g/dL (ref 13.2–17.1)
Lymphs Abs: 1611 {cells}/uL (ref 850–3900)
MCH: 30.2 pg (ref 27.0–33.0)
MCHC: 33.8 g/dL (ref 32.0–36.0)
MCV: 89.3 fL (ref 80.0–100.0)
MPV: 10 fL (ref 7.5–12.5)
Monocytes Relative: 7.5 %
Neutro Abs: 6239 {cells}/uL (ref 1500–7800)
Neutrophils Relative %: 70.1 %
Platelets: 217 Thousand/uL (ref 140–400)
RBC: 5.16 Million/uL (ref 4.20–5.80)
RDW: 13.2 % (ref 11.0–15.0)
Total Lymphocyte: 18.1 %
WBC: 8.9 Thousand/uL (ref 3.8–10.8)

## 2019-05-08 LAB — COMPLETE METABOLIC PANEL WITHOUT GFR
AG Ratio: 1.6 (calc) (ref 1.0–2.5)
ALT: 11 U/L (ref 9–46)
AST: 13 U/L (ref 10–40)
Albumin: 4.3 g/dL (ref 3.6–5.1)
Alkaline phosphatase (APISO): 61 U/L (ref 36–130)
BUN: 9 mg/dL (ref 7–25)
CO2: 30 mmol/L (ref 20–32)
Calcium: 9.7 mg/dL (ref 8.6–10.3)
Chloride: 100 mmol/L (ref 98–110)
Creat: 0.85 mg/dL (ref 0.60–1.35)
GFR, Est African American: 133 mL/min/1.73m2
GFR, Est Non African American: 114 mL/min/1.73m2
Globulin: 2.7 g/dL (ref 1.9–3.7)
Glucose, Bld: 124 mg/dL — ABNORMAL HIGH (ref 65–99)
Potassium: 4.2 mmol/L (ref 3.5–5.3)
Sodium: 139 mmol/L (ref 135–146)
Total Bilirubin: 0.5 mg/dL (ref 0.2–1.2)
Total Protein: 7 g/dL (ref 6.1–8.1)

## 2019-05-08 LAB — PAIN MGMT, PROFILE 6 CONF W/O MM, U
6 Acetylmorphine: NEGATIVE ng/mL
Alcohol Metabolites: POSITIVE ng/mL — AB (ref <500)
Alphahydroxyalprazolam: NEGATIVE ng/mL
Alphahydroxymidazolam: NEGATIVE ng/mL
Alphahydroxytriazolam: NEGATIVE ng/mL
Aminoclonazepam: NEGATIVE ng/mL
Amphetamines: NEGATIVE ng/mL
Barbiturates: NEGATIVE ng/mL
Benzodiazepines: POSITIVE ng/mL
Cocaine Metabolite: NEGATIVE ng/mL
Creatinine: 86.6 mg/dL
Ethyl Glucuronide (ETG): 5298 ng/mL
Ethyl Sulfate (ETS): 445 ng/mL
Hydroxyethylflurazepam: NEGATIVE ng/mL
Lorazepam: NEGATIVE ng/mL
Marijuana Metabolite: NEGATIVE ng/mL
Methadone Metabolite: NEGATIVE ng/mL
Nordiazepam: NEGATIVE ng/mL
Opiates: NEGATIVE ng/mL
Oxazepam: 1783 ng/mL
Oxidant: NEGATIVE ug/mL
Oxycodone: NEGATIVE ng/mL
Phencyclidine: NEGATIVE ng/mL
Temazepam: >8000 ng/mL
pH: 7.3 (ref 4.5–9.0)

## 2019-05-08 LAB — LAMOTRIGINE LEVEL: Lamotrigine Lvl: 1.4 ug/mL — ABNORMAL LOW (ref 4.0–18.0)

## 2019-05-08 LAB — TSH: TSH: 2.17 m[IU]/L (ref 0.40–4.50)

## 2019-05-08 NOTE — Telephone Encounter (Signed)
Called back and left msg. I gave her my cell which you can again if she calls back. Also this was not a referral but an FYI on a patient that stated he was a patient there getting suboxone. I was letting them know he was taking more than prescribed and going to be out 2 weeks early. I did not know if they could see him early and talk about compliance with patient.

## 2019-05-09 NOTE — Progress Notes (Signed)
Darly,   Your UDS showed temazepam and alcohol in fairly large amts. This is why you had the alerted mental state the other night. You are not prescribed temazepam as far as my med list shows. You are not supposed to heavily drink with your current medications. Also no suboxone showed up in urine. When was your last dose of suboxone?   We should fax these results over to [facility] where he gets his suboxone.

## 2019-05-13 NOTE — Telephone Encounter (Signed)
JJ,  Do you know if this was sent

## 2019-05-13 NOTE — Telephone Encounter (Signed)
No never called. Did we send over drug screen? If not can we.

## 2019-05-13 NOTE — Telephone Encounter (Signed)
Yes, I sent over today. Documented on result note.

## 2019-05-13 NOTE — Telephone Encounter (Signed)
Did you ever hear back?

## 2019-05-15 ENCOUNTER — Ambulatory Visit (INDEPENDENT_AMBULATORY_CARE_PROVIDER_SITE_OTHER): Admitting: Physician Assistant

## 2019-05-15 ENCOUNTER — Other Ambulatory Visit: Payer: Self-pay

## 2019-05-15 VITALS — BP 162/99 | HR 93 | Ht 73.0 in | Wt 189.0 lb

## 2019-05-15 DIAGNOSIS — I10 Essential (primary) hypertension: Secondary | ICD-10-CM

## 2019-05-15 DIAGNOSIS — F332 Major depressive disorder, recurrent severe without psychotic features: Secondary | ICD-10-CM | POA: Diagnosis not present

## 2019-05-15 DIAGNOSIS — F1910 Other psychoactive substance abuse, uncomplicated: Secondary | ICD-10-CM

## 2019-05-15 DIAGNOSIS — F1010 Alcohol abuse, uncomplicated: Secondary | ICD-10-CM

## 2019-05-15 DIAGNOSIS — F1121 Opioid dependence, in remission: Secondary | ICD-10-CM

## 2019-05-15 DIAGNOSIS — F411 Generalized anxiety disorder: Secondary | ICD-10-CM

## 2019-05-15 DIAGNOSIS — F900 Attention-deficit hyperactivity disorder, predominantly inattentive type: Secondary | ICD-10-CM

## 2019-05-15 DIAGNOSIS — G894 Chronic pain syndrome: Secondary | ICD-10-CM

## 2019-05-15 DIAGNOSIS — F1310 Sedative, hypnotic or anxiolytic abuse, uncomplicated: Secondary | ICD-10-CM

## 2019-05-15 MED ORDER — VORTIOXETINE HBR 20 MG PO TABS: 20.0000 mg | ORAL_TABLET | Freq: Every day | ORAL | 3 refills | Status: DC

## 2019-05-15 NOTE — Progress Notes (Signed)
Subjective:    Patient ID: Cole [name], male    DOB: 1986-02-15, 34 y.o.   MRN: [id number]  HPI  Pt is a 34 yo male who presents to the clinic with long history of drug and alcohol abuse with ADHD, MDD, GAD concerned about medications.   Recently seen for disorentiation and found to have large amounts of temazepam and alcohol with no opiates. He is not prescribed temazepam. He is out of suboxone from Dr. Rickey [name] at [place] [facility]. He was tnot taking as prescribed.   He states trintellix is too expensive. It has helped but cannot afford.   He is not happy with his [facility] team. He would like another referral. He just finished his 3 times a week counseling.   .. Active Ambulatory Problems    Diagnosis Date Noted  . ALCOHOL WITHDRAWAL 08/04/2009  . DRUG ABUSE, IN REMISSION 08/25/2009  . DEPRESSIVE DISORDER NOT ELSEWHERE CLASSIFIED 11/17/2008  . MIGRAINE, CLASSICAL, INTRACTABLE 03/12/2009  . SINUS TACHYCARDIA 06/29/2009  . 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 [place] DISEASE, LUMBAR 01/14/2009  . LOW BACK PAIN, CHRONIC 11/17/2008  . SYNCOPE 07/27/2009  . NAUSEA WITH VOMITING 03/12/2009  . ELEVATED BLOOD PRESSURE WITHOUT DIAGNOSIS OF HYPERTENSION 02/10/2009  . ADHD (attention deficit hyperactivity disorder) 10/11/2012  . History of narcotic addiction ([place]) 10/29/2012  . Attention deficit hyperactivity disorder (ADHD), 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  . Tachycardia 12/18/2014  . Depression, recurrent ([place]) 12/26/2016  . GAD (generalized anxiety disorder) 12/26/2016  . History of allergic reaction 02/06/2017  . Angioedema 10/16/2018  . Allergic reaction/recurrent urticaria 10/16/2018  . Severe episode of recurrent major depressive disorder, without psychotic features ([place]) 10/16/2018  . Recurrent urticaria 11/20/2018  . Chronic rhinitis 11/20/2018  . Transient amnesia 12/17/2018  . Anxiety 12/17/2018  . Acute stress reaction 12/17/2018  .  Essential hypertension 03/06/2019  . Drug abuse ([place]) 03/06/2019  . Alcohol abuse 04/08/2019  . Disorientation 05/07/2019  . Noncompliance with medication treatment due to overuse of medication 05/07/2019  . History of alcohol abuse 05/07/2019  . Chronic pain syndrome 05/17/2019  . Benzodiazepine abuse ([place]) 05/17/2019   Resolved Ambulatory Problems    Diagnosis Date Noted  . NUMBNESS 12/15/2008  . Dysuria 01/06/2009  . FRACTURE, FOOT 12/09/2008  . Burn of unspecified degree of forearm 01/26/2009  . Toe injury 07/18/2014  . Tinea pedis of both feet 07/18/2014  . Left elbow pain 01/20/2017   Past Medical History:  Diagnosis Date  . Alcoholism ([place])   . DDD (degenerative disc disease)   . Depression   . History of ADHD   . Hyperlipidemia   . Hypertension          Review of Systems See HPI.     Objective:   Physical Exam Vitals reviewed.  Neurological:     General: No focal deficit present.     Mental Status: He is alert and oriented to person, place, and time.  Psychiatric:     Comments: Flat affect.     .. Depression screen [facility] 2/9 05/15/2019 05/06/2019 04/02/2019 03/05/2019 12/14/2018  Decreased Interest 2 2 2 1 2   Down, Depressed, Hopeless 1 2 3 1 2   PHQ - 2 Score 3 4 5 2 4   Altered sleeping 2 2 - 2 2  Tired, decreased energy 2 2 2 2 2   Change in appetite 2 3 2 2 3   Feeling bad or failure about yourself  1 3 3 2 3   Trouble concentrating 1  2 1 3 2   Moving slowly or fidgety/restless 1 2 1 1 1   Suicidal thoughts 0 1 1 1  0  PHQ-9 Score 12 19 - 15 17  Difficult doing work/chores Very difficult Very difficult Very difficult Very difficult Very difficult   . GAD 7 : Generalized Anxiety Score 05/15/2019 05/06/2019 04/02/2019 03/05/2019  Nervous, Anxious, on Edge 2 2 2 1   Control/stop worrying 1 2 2 1   Worry too much - different things 1 1 2 2   Trouble relaxing 1 2 1 2   Restless 1 1 1 2   Easily annoyed or irritable 2 2 3 3   Afraid - awful might happen 1 1 1 1    Total GAD 7 Score 9 11 12 12   Anxiety Difficulty Very difficult Very difficult Very difficult Very difficult          Assessment & Plan:  4/1/202112/31/2020[name] was seen today for follow-up.  Diagnoses and all orders for this visit:  Severe episode of recurrent major depressive disorder, without psychotic features (HCC) -     vortioxetine HBr (TRINTELLIX) 20 MG TABS tablet; Take 1 tablet (20 mg total) by mouth daily.  Essential hypertension -     lisinopril (ZESTRIL) 10 MG tablet; Take 1 tablet (10 mg total) by mouth daily. For blood pressure.  Attention deficit hyperactivity disorder (ADHD), predominantly inattentive type  Alcohol abuse  GAD (generalized anxiety disorder)  History of narcotic addiction (HCC)  Drug abuse (HCC)  Chronic pain syndrome   Discussed previous UDS. He must not take medications not prescribed. He must not drink alcohol.  Needs to follow up and get back on suboxone.  Will make new referral to [facility] for counseling and psychiatrist.  Call drug rep. Got samples coming to patients house.  Filled out patient assistance for trintillix in office. I do not want him out of this medication.  Go to AA and NA meetings.   BP elevated today.  Start lisinopril 10mg  daily.  Follow up in 4 weeks.    Spent 45 minutes with patient and coordinating care.

## 2019-05-15 NOTE — Patient Instructions (Signed)
Will make referral to [facility].

## 2019-05-17 ENCOUNTER — Encounter: Payer: Self-pay | Admitting: Physician Assistant

## 2019-05-17 DIAGNOSIS — G894 Chronic pain syndrome: Secondary | ICD-10-CM | POA: Insufficient documentation

## 2019-05-17 DIAGNOSIS — F1310 Sedative, hypnotic or anxiolytic abuse, uncomplicated: Secondary | ICD-10-CM | POA: Insufficient documentation

## 2019-05-17 MED ORDER — LISINOPRIL 10 MG PO TABS: 10.0000 mg | ORAL_TABLET | Freq: Every day | ORAL | 1 refills | Status: DC

## 2019-05-29 ENCOUNTER — Other Ambulatory Visit: Payer: Self-pay | Admitting: Physician Assistant

## 2019-05-29 DIAGNOSIS — R4586 Emotional lability: Secondary | ICD-10-CM

## 2019-05-29 DIAGNOSIS — F430 Acute stress reaction: Secondary | ICD-10-CM

## 2019-05-29 DIAGNOSIS — F419 Anxiety disorder, unspecified: Secondary | ICD-10-CM

## 2019-05-29 DIAGNOSIS — R413 Other amnesia: Secondary | ICD-10-CM

## 2019-06-18 ENCOUNTER — Other Ambulatory Visit: Payer: Self-pay | Admitting: Physician Assistant

## 2019-06-18 DIAGNOSIS — I10 Essential (primary) hypertension: Secondary | ICD-10-CM

## 2019-07-01 ENCOUNTER — Other Ambulatory Visit: Payer: Self-pay

## 2019-07-01 ENCOUNTER — Encounter: Payer: Self-pay | Admitting: Family Medicine

## 2019-07-01 ENCOUNTER — Ambulatory Visit (INDEPENDENT_AMBULATORY_CARE_PROVIDER_SITE_OTHER): Admitting: Family Medicine

## 2019-07-01 VITALS — BP 127/82 | HR 90 | Ht 73.0 in | Wt 207.0 lb

## 2019-07-01 DIAGNOSIS — N644 Mastodynia: Secondary | ICD-10-CM | POA: Diagnosis not present

## 2019-07-01 LAB — PROLACTIN: Prolactin: 14.2 ng/mL (ref 2.0–18.0)

## 2019-07-01 LAB — FSH/LH
FSH: 3.2 m[IU]/mL (ref 1.6–8.0)
LH: 1.5 m[IU]/mL (ref 1.5–9.3)

## 2019-07-01 LAB — TESTOSTERONE: Testosterone: 220 ng/dL — ABNORMAL LOW (ref 250–827)
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
## 2019-07-01 NOTE — Patient Instructions (Signed)
Have labs completed, we'll be in touch with results.  You will be contacted for imaging studies.

## 2019-07-01 NOTE — Assessment & Plan Note (Signed)
Appears to be gynecomastia.   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  Check testosterone, FSH/LH and prolactin.  Will also get diagnostic mammo/US.

## 2019-07-05 ENCOUNTER — Other Ambulatory Visit: Payer: Self-pay | Admitting: Family Medicine

## 2019-07-05 DIAGNOSIS — N644 Mastodynia: Secondary | ICD-10-CM

## 2019-07-16 ENCOUNTER — Other Ambulatory Visit: Payer: Self-pay | Admitting: Family Medicine

## 2019-07-16 DIAGNOSIS — N644 Mastodynia: Secondary | ICD-10-CM

## 2019-07-17 ENCOUNTER — Encounter: Payer: Self-pay | Admitting: Family Medicine

## 2019-07-19 ENCOUNTER — Other Ambulatory Visit

## 2019-07-19 ENCOUNTER — Encounter

## 2019-07-25 ENCOUNTER — Other Ambulatory Visit: Payer: Self-pay | Admitting: Neurology

## 2019-07-25 ENCOUNTER — Other Ambulatory Visit: Payer: Self-pay

## 2019-07-25 ENCOUNTER — Ambulatory Visit
Admission: RE | Admit: 2019-07-25 | Discharge: 2019-07-25 | Disposition: A | Discharge status: Home / Self Care | Source: Ambulatory Visit | Attending: Family Medicine | Admitting: Family Medicine

## 2019-07-25 ENCOUNTER — Ambulatory Visit

## 2019-07-25 ENCOUNTER — Ambulatory Visit: Admission: RE | Admit: 2019-07-25 | Source: Ambulatory Visit

## 2019-07-25 DIAGNOSIS — N644 Mastodynia: Secondary | ICD-10-CM

## 2019-07-25 DIAGNOSIS — I10 Essential (primary) hypertension: Secondary | ICD-10-CM

## 2019-07-25 MED ORDER — LISINOPRIL 10 MG PO TABS: 10.0000 mg | ORAL_TABLET | Freq: Every day | ORAL | 1 refills | Status: DC

## 2019-09-04 ENCOUNTER — Encounter: Payer: Self-pay | Admitting: Physician Assistant

## 2019-09-06 ENCOUNTER — Telehealth: Payer: Self-pay | Admitting: Physician Assistant

## 2019-09-06 MED ORDER — LORAZEPAM 0.5 MG PO TABS
0.5000 mg | ORAL_TABLET | Freq: Two times a day (BID) | ORAL | 0 refills | Status: DC | PRN
Start: 2019-09-06 — End: 2019-09-18

## 2019-09-06 MED ORDER — ONDANSETRON HCL 4 MG PO TABS: 4.0000 mg | ORAL_TABLET | Freq: Three times a day (TID) | ORAL | 2 refills | Status: DC | PRN

## 2019-09-06 NOTE — Telephone Encounter (Signed)
Reviewed hospital visit on 09/03/2019 for alcohol withdrawal.  He was given IV fluids, Ativan via IV and Zofran.  He does request refills until he can be seen for follow-up.  He is still having significant problems with withdrawal symptoms.  I did give him a small quantity.  We will follow-up next week.

## 2019-09-16 ENCOUNTER — Encounter: Payer: Self-pay | Admitting: Physician Assistant

## 2019-09-16 MED ORDER — COLCHICINE 0.6 MG PO TABS
ORAL_TABLET | ORAL | 0 refills | Status: DC
Start: 2019-09-16 — End: 2019-12-04

## 2019-09-16 NOTE — Telephone Encounter (Signed)
Hosp f/u scheduled 09/18/19

## 2019-09-18 ENCOUNTER — Encounter: Payer: Self-pay | Admitting: Physician Assistant

## 2019-09-18 ENCOUNTER — Telehealth (INDEPENDENT_AMBULATORY_CARE_PROVIDER_SITE_OTHER): Admitting: Physician Assistant

## 2019-09-18 VITALS — Ht 73.0 in | Wt 207.0 lb

## 2019-09-18 DIAGNOSIS — I10 Essential (primary) hypertension: Secondary | ICD-10-CM | POA: Diagnosis not present

## 2019-09-18 DIAGNOSIS — F419 Anxiety disorder, unspecified: Secondary | ICD-10-CM

## 2019-09-18 DIAGNOSIS — F1010 Alcohol abuse, uncomplicated: Secondary | ICD-10-CM | POA: Diagnosis not present

## 2019-09-18 DIAGNOSIS — F10239 Alcohol dependence with withdrawal, unspecified: Secondary | ICD-10-CM

## 2019-09-18 DIAGNOSIS — F339 Major depressive disorder, recurrent, unspecified: Secondary | ICD-10-CM

## 2019-09-18 DIAGNOSIS — F900 Attention-deficit hyperactivity disorder, predominantly inattentive type: Secondary | ICD-10-CM

## 2019-09-18 DIAGNOSIS — F1920 Other psychoactive substance dependence, uncomplicated: Secondary | ICD-10-CM | POA: Diagnosis not present

## 2019-09-18 DIAGNOSIS — F10939 Alcohol use, unspecified with withdrawal, unspecified: Secondary | ICD-10-CM

## 2019-09-18 MED ORDER — CLONIDINE HCL 0.2 MG PO TABS: ORAL_TABLET | ORAL | 2 refills | Status: DC
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
## 2019-09-18 NOTE — Progress Notes (Signed)
Recent hospital visit for alcohol withdrawal Patient states doing better PHQ9-GAD7 completed.

## 2019-10-03 ENCOUNTER — Other Ambulatory Visit: Payer: Self-pay | Admitting: Neurology

## 2019-10-03 NOTE — Telephone Encounter (Signed)
Received paper request from [facility] pharmacy for Lorazepam 0.5 mg BID. No longer on med list. Last note states not on medication anymore. Denied.

## 2019-10-08 ENCOUNTER — Other Ambulatory Visit: Payer: Self-pay | Admitting: Neurology

## 2019-10-08 DIAGNOSIS — I10 Essential (primary) hypertension: Secondary | ICD-10-CM

## 2019-10-08 MED ORDER — LISINOPRIL 10 MG PO TABS: 10.0000 mg | ORAL_TABLET | Freq: Every day | ORAL | 0 refills | Status: DC

## 2019-11-13 ENCOUNTER — Other Ambulatory Visit: Payer: Self-pay | Admitting: Physician Assistant

## 2019-11-13 DIAGNOSIS — I10 Essential (primary) hypertension: Secondary | ICD-10-CM

## 2019-11-15 ENCOUNTER — Other Ambulatory Visit: Payer: Self-pay | Admitting: Physician Assistant

## 2019-11-23 DIAGNOSIS — J9601 Acute respiratory failure with hypoxia: Secondary | ICD-10-CM | POA: Insufficient documentation

## 2019-11-23 DIAGNOSIS — R748 Abnormal levels of other serum enzymes: Secondary | ICD-10-CM | POA: Insufficient documentation

## 2019-11-24 DIAGNOSIS — R531 Weakness: Secondary | ICD-10-CM | POA: Insufficient documentation

## 2019-12-04 ENCOUNTER — Encounter: Payer: Self-pay | Admitting: Physician Assistant

## 2019-12-04 ENCOUNTER — Ambulatory Visit (INDEPENDENT_AMBULATORY_CARE_PROVIDER_SITE_OTHER): Admitting: Physician Assistant

## 2019-12-04 VITALS — BP 150/94 | HR 113 | Ht 73.0 in | Wt 210.0 lb

## 2019-12-04 DIAGNOSIS — T50901A Poisoning by unspecified drugs, medicaments and biological substances, accidental (unintentional), initial encounter: Secondary | ICD-10-CM | POA: Insufficient documentation

## 2019-12-04 DIAGNOSIS — N179 Acute kidney failure, unspecified: Secondary | ICD-10-CM

## 2019-12-04 DIAGNOSIS — T50901D Poisoning by unspecified drugs, medicaments and biological substances, accidental (unintentional), subsequent encounter: Secondary | ICD-10-CM

## 2019-12-04 DIAGNOSIS — R748 Abnormal levels of other serum enzymes: Secondary | ICD-10-CM

## 2019-12-04 DIAGNOSIS — I10 Essential (primary) hypertension: Secondary | ICD-10-CM

## 2019-12-04 MED ORDER — LOSARTAN POTASSIUM 50 MG PO TABS: 50.0000 mg | ORAL_TABLET | Freq: Every day | ORAL | 1 refills | Status: DC
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
## 2019-12-04 NOTE — Patient Instructions (Signed)
Contact [facility] [address] [address] [place], [place] [address] Located at: [address] [place], [place] [address]  Phone: [phone] Fax: [phone]  Email: [email or web address]

## 2019-12-12 ENCOUNTER — Other Ambulatory Visit: Payer: Self-pay | Admitting: Physician Assistant

## 2019-12-12 DIAGNOSIS — I10 Essential (primary) hypertension: Secondary | ICD-10-CM

## 2019-12-26 ENCOUNTER — Other Ambulatory Visit: Payer: Self-pay

## 2019-12-26 ENCOUNTER — Emergency Department (INDEPENDENT_AMBULATORY_CARE_PROVIDER_SITE_OTHER)
Admission: RE | Admit: 2019-12-26 | Discharge: 2019-12-26 | Disposition: A | Discharge status: Home / Self Care | Source: Ambulatory Visit

## 2019-12-26 ENCOUNTER — Emergency Department

## 2019-12-26 VITALS — BP 118/82 | HR 111 | Temp 99.2°F | Resp 20 | Ht 73.0 in | Wt 200.0 lb

## 2019-12-26 DIAGNOSIS — J029 Acute pharyngitis, unspecified: Secondary | ICD-10-CM

## 2019-12-26 DIAGNOSIS — B370 Candidal stomatitis: Secondary | ICD-10-CM

## 2019-12-26 DIAGNOSIS — Z9189 Other specified personal risk factors, not elsewhere classified: Secondary | ICD-10-CM | POA: Diagnosis not present

## 2019-12-26 DIAGNOSIS — R202 Paresthesia of skin: Secondary | ICD-10-CM

## 2019-12-26 DIAGNOSIS — Z8673 Personal history of transient ischemic attack (TIA), and cerebral infarction without residual deficits: Secondary | ICD-10-CM

## 2019-12-26 DIAGNOSIS — M79604 Pain in right leg: Secondary | ICD-10-CM

## 2019-12-26 DIAGNOSIS — Z20822 Contact with and (suspected) exposure to covid-19: Secondary | ICD-10-CM

## 2019-12-26 LAB — POCT RAPID STREP A (OFFICE): Rapid Strep A Screen: NEGATIVE

## 2019-12-26 MED ORDER — CLOTRIMAZOLE 10 MG MT TROC: 10.0000 mg | Freq: Every day | OROMUCOSAL | 0 refills | Status: AC
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
## 2019-12-26 NOTE — ED Triage Notes (Addendum)
Pt states he had swelling in his RLE & RUE after passing out from drinking alcohol EMS took pt to hospital - was there for one week - pt unsure if he had a blood clot Per pt he was only "passed out for a few hours"  (notes state hypoxic event) Pt here today for sore throat pain (started 2 days ago)  and swelling to RLE the last 2 days Pt drinking Red Bull in triage NO COVID Vaccine

## 2019-12-26 NOTE — Discharge Instructions (Signed)
  Due to concern for possibly having Covid-19, it is advised that you self-isolate at home until test results come back, usually 2-3 days.  If positive, it is recommended you stay isolated for at least 10 days after symptom onset and 24 hours after last fever without taking medication (whichever is longer).  If you MUST go out, please wear a mask at all times, limit contact with others.   If your test is negative, you still have plenty of time to get the Covid vaccine. It is recommended you schedule an appointment to get your vaccine once you get over this current illness.  Please ask your primary care provider about any questions/concerns related to the vaccine.    Call to schedule a follow up appointment with your primary care provider next week for fe-check of your symptoms.  You may also call [facility] to see if you can be seen by a neurologist sooner than one month from now for your Right leg symptoms since being hospitalized for a stroke last month.

## 2019-12-28 LAB — NOVEL CORONAVIRUS, NAA: SARS-CoV-2, NAA: NOT DETECTED

## 2019-12-28 LAB — SARS-COV-2, NAA 2 DAY TAT

## 2020-01-13 ENCOUNTER — Ambulatory Visit: Admitting: Nurse Practitioner
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
## 2020-02-06 ENCOUNTER — Encounter: Payer: Self-pay | Admitting: Physician Assistant

## 2020-02-06 DIAGNOSIS — F1920 Other psychoactive substance dependence, uncomplicated: Secondary | ICD-10-CM

## 2020-02-06 NOTE — Telephone Encounter (Signed)
Referral placed.

## 2020-02-07 ENCOUNTER — Telehealth: Payer: Self-pay | Admitting: Physician Assistant

## 2020-02-07 NOTE — Telephone Encounter (Signed)
We have talked about this before but [facility] is a addiction rehab program that is hugely successful. I have a name of a person I would like patient to call he is aware you will be calling him. His name is Elige Radon and number [phone].

## 2020-02-10 NOTE — Telephone Encounter (Signed)
I have not had an opportunity to work on this- adding JJ to the thread in case she is able to reach our before I am

## 2020-02-10 NOTE — Telephone Encounter (Signed)
Tried to call patient with information. No answer. VM not set up. Mychart message sent to patient.

## 2020-02-11 NOTE — Telephone Encounter (Signed)
Tried to call twice more today, number goes straight to vm. VM not accepting messages.

## 2020-02-15 ENCOUNTER — Other Ambulatory Visit: Payer: Self-pay | Admitting: Physician Assistant

## 2020-02-15 DIAGNOSIS — I10 Essential (primary) hypertension: Secondary | ICD-10-CM

## 2020-03-17 ENCOUNTER — Ambulatory Visit (INDEPENDENT_AMBULATORY_CARE_PROVIDER_SITE_OTHER): Admitting: Physician Assistant

## 2020-03-17 DIAGNOSIS — Z5329 Procedure and treatment not carried out because of patient's decision for other reasons: Secondary | ICD-10-CM

## 2020-03-17 NOTE — Progress Notes (Signed)
No show

## 2020-03-18 ENCOUNTER — Ambulatory Visit (HOSPITAL_COMMUNITY): Admitting: Licensed Clinical Social Worker

## 2020-03-18 ENCOUNTER — Other Ambulatory Visit: Payer: Self-pay

## 2020-03-18 DIAGNOSIS — F1114 Opioid abuse with opioid-induced mood disorder: Secondary | ICD-10-CM

## 2020-03-18 NOTE — Progress Notes (Signed)
Met with client in person to review referral for [facility]. This clinic is unable to accept insurance at this time for CDIOP, only OPT, however in session clinician and client were able to schedule client with [facility] for a SAIOP intake on 03/19/20. Clinician contacted referral source [facility]) to request bridge prescriptions for client. Clinician shared with client there is no suboxone program at this clinic and information was not relayed he was on MAT. Client is agreed to [facility] referral and confirmed intake tomorrow. Client will attempt to contact [facility] as well to request medication refill until scheduled appointment. Clinician provided urgent care and emergency room care for any withdrawal symptoms of suboxone. Client will call back if additional referrals are needed. Clinician encouraged client to follow up with PCP after missed appointment previous day.

## 2020-03-31 ENCOUNTER — Encounter: Payer: Self-pay | Admitting: Physician Assistant

## 2020-03-31 ENCOUNTER — Ambulatory Visit (INDEPENDENT_AMBULATORY_CARE_PROVIDER_SITE_OTHER): Admitting: Physician Assistant

## 2020-03-31 ENCOUNTER — Other Ambulatory Visit: Payer: Self-pay

## 2020-03-31 VITALS — BP 117/72 | HR 104 | Ht 73.0 in | Wt 209.0 lb

## 2020-03-31 DIAGNOSIS — I10 Essential (primary) hypertension: Secondary | ICD-10-CM

## 2020-03-31 DIAGNOSIS — M545 Low back pain, unspecified: Secondary | ICD-10-CM

## 2020-03-31 DIAGNOSIS — F1123 Opioid dependence with withdrawal: Secondary | ICD-10-CM

## 2020-03-31 DIAGNOSIS — F332 Major depressive disorder, recurrent severe without psychotic features: Secondary | ICD-10-CM | POA: Diagnosis not present

## 2020-03-31 DIAGNOSIS — Z79899 Other long term (current) drug therapy: Secondary | ICD-10-CM | POA: Diagnosis not present

## 2020-03-31 DIAGNOSIS — F4325 Adjustment disorder with mixed disturbance of emotions and conduct: Secondary | ICD-10-CM

## 2020-03-31 DIAGNOSIS — G8929 Other chronic pain: Secondary | ICD-10-CM

## 2020-03-31 DIAGNOSIS — F1124 Opioid dependence with opioid-induced mood disorder: Secondary | ICD-10-CM

## 2020-03-31 MED ORDER — QUETIAPINE FUMARATE 50 MG PO TABS: 50.0000 mg | ORAL_TABLET | Freq: Every day | ORAL | 1 refills | Status: DC

## 2020-03-31 MED ORDER — CYCLOBENZAPRINE HCL 10 MG PO TABS: 10.0000 mg | ORAL_TABLET | Freq: Three times a day (TID) | ORAL | 1 refills | Status: DC | PRN

## 2020-03-31 MED ORDER — NAPROXEN 500 MG PO TABS: 500.0000 mg | ORAL_TABLET | Freq: Two times a day (BID) | ORAL | 1 refills | Status: DC

## 2020-03-31 MED ORDER — LOSARTAN POTASSIUM 50 MG PO TABS: ORAL_TABLET | ORAL | 3 refills | Status: DC

## 2020-03-31 NOTE — Patient Instructions (Signed)
Start seroquel 1-3 tablets at bedtime.

## 2020-03-31 NOTE — Progress Notes (Signed)
Subjective:    Patient ID: Cole [name], male    DOB: 1985/05/30, 34 y.o.   MRN: [id number]  HPI  Pt is a 34 yo male with HTN, chronic pain, opioid and alcohol abuse history, ADHD, MDD, GAD who presents to the clinic to discuss medications.   He just got back from a 1 month intensive which really helped. He is 6 months sober from alcohol and 72 days sober from opioids.   He is going to counseling every 2 weeks. He does not think abilify is helping and thinks it is making is depression worse. No SI/HC.   He wants to feel more motivated and also less anxious. He is having a lot of problems focusing as well. He has ADHD and would like something to help with that as well. He is ready to get healthy. He admits he is not sleeping very well. Problems going and staying asleep.   Chronic pain on naproxen and flexeril. He is also doing PT exercises every night at home. He does need refills.   .. 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   DISC DISEASE, LUMBAR 01/14/2009   LOW BACK PAIN, CHRONIC 11/17/2008   SYNCOPE 07/27/2009   NAUSEA WITH VOMITING 03/12/2009   ELEVATED BLOOD PRESSURE WITHOUT DIAGNOSIS OF HYPERTENSION 02/10/2009   ADHD (attention deficit hyperactivity disorder) 10/11/2012   History of narcotic addiction (HCC) 10/29/2012   Attention deficit hyperactivity disorder (ADHD), 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   Tachycardia 12/18/2014   Depression, recurrent (HCC) 12/26/2016   GAD (generalized anxiety disorder) 12/26/2016   History of allergic reaction 02/06/2017   Angioedema 10/16/2018   Allergic reaction/recurrent urticaria 10/16/2018   Severe episode of recurrent major  depressive disorder, without psychotic features (HCC) 10/16/2018   Recurrent urticaria 11/20/2018   Chronic rhinitis 11/20/2018   Transient amnesia 12/17/2018   Anxiety 12/17/2018   Acute stress reaction 12/17/2018   Essential hypertension 03/06/2019   Drug abuse (HCC) 03/06/2019   Alcohol abuse 04/08/2019   Disorientation 05/07/2019   Noncompliance with medication treatment due to overuse of medication 05/07/2019   History of alcohol abuse 05/07/2019   Chronic pain syndrome 05/17/2019   Benzodiazepine abuse (HCC) 05/17/2019   Breast pain in male 07/01/2019   Addiction (HCC) 09/18/2019   AKI (acute kidney injury) (HCC) 12/04/2019   Elevated liver enzymes 12/04/2019   Accidental overdose 12/04/2019   Abnormal transaminases 11/23/2019   Cervical radiculopathy 09/14/2017   Glenoid labrum tear 04/06/2017   Mild episode of recurrent major depressive disorder (HCC) 12/10/2018   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 12/10/2018   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   Tobacco use disorder 02/11/2019   Weakness 11/24/2019   Resolved Ambulatory Problems    Diagnosis Date Noted   NUMBNESS 12/15/2008   Dysuria 01/06/2009   FRACTURE, FOOT 12/09/2008   Burn of unspecified degree of forearm 01/26/2009   Toe injury 07/18/2014   Tinea pedis of both feet 07/18/2014   Left elbow pain 01/20/2017   Acute respiratory failure with hypoxia (HCC) 11/23/2019   Past Medical History:  Diagnosis Date   Alcoholism (HCC)    DDD (degenerative disc disease)    Depression  History of ADHD    Hyperlipidemia    Hypertension       Review of Systems  All other systems reviewed and are negative.      Objective:   Physical Exam Vitals reviewed.  Constitutional:      Appearance: Normal appearance.  Cardiovascular:     Rate and Rhythm: Normal rate  and regular rhythm.     Pulses: Normal pulses.  Musculoskeletal:     Right lower leg: No edema.     Left lower leg: No edema.  Neurological:     General: No focal deficit present.     Mental Status: He is alert and oriented to person, place, and time.  Psychiatric:        Mood and Affect: Mood normal.       .. Depression screen [facility] 2/9 03/31/2020 09/18/2019 05/15/2019 05/06/2019 04/02/2019  Decreased Interest 3 2 2 2 2   Down, Depressed, Hopeless 3 3 1 2 3   PHQ - 2 Score 6 5 3 4 5   Altered sleeping 3 3 2 2  -  Tired, decreased energy 3 3 2 2 2   Change in appetite 3 3 2 3 2   Feeling bad or failure about yourself  3 3 1 3 3   Trouble concentrating 2 2 1 2 1   Moving slowly or fidgety/restless 2 2 1 2 1   Suicidal thoughts 1 1 0 1 1  PHQ-9 Score 23 22 12 19  -  Difficult doing work/chores - Very difficult Very difficult Very difficult Very difficult  Some recent data might be hidden   . GAD 7 : Generalized Anxiety Score 03/31/2020 09/18/2019 05/15/2019 05/06/2019  Nervous, Anxious, on Edge 3 3 2 2   Control/stop worrying 3 2 1 2   Worry too much - different things 3 3 1 1   Trouble relaxing 3 3 1 2   Restless 3 3 1 1   Easily annoyed or irritable 3 3 2 2   Afraid - awful might happen 2 3 1 1   Total GAD 7 Score 20 20 9 11   Anxiety Difficulty Extremely difficult Somewhat difficult Very difficult Very difficult        Assessment & Plan:   Caydn was seen today for follow-up.  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  Severe episode of  recurrent major depressive disorder, without psychotic features (HCC) -     QUEtiapine (SEROQUEL) 50 MG tablet; Take 1 tablet (50 mg total) by mouth at bedtime.  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  Chronic bilateral low back pain without sciatica -     cyclobenzaprine (FLEXERIL) 10 MG tablet; Take 1 tablet (10 mg total) by mouth 3 (three) times daily as needed. -     naproxen (NAPROSYN) 500 MG tablet; Take 1 tablet (500 mg total) by mouth 2 (two) times daily with a meal.  PHQ-9 and GAD-7 are both out of range.  He has not controlled on his medication.  Discussed with patient this would be a process.  We were going to try medication 1 at a time and titrate him to where he needs to be. Start with seroquel 1-3 tablets at bedtime. I understand he is having trouble focusing but we want to get his mood more stable before we add any potential addictive substance.  I am concerned about adding a stimulant for ADHD.  We may consider adding Wellbutrin and/or Strattera first.  Stop Abilify start Seroquel. Will consider SSRI/SSNRI in future. Continue counseling. Follow up in 4 weeks.   Refilled flexeril and NSAID for chronic back pain.   BP to goal. Refilled losartan.   Needs labs to check kidney and liver.

## 2020-04-01 LAB — COMPLETE METABOLIC PANEL WITH GFR
AG Ratio: 1.5 (calc) (ref 1.0–2.5)
ALT: 10 U/L (ref 9–46)
AST: 11 U/L (ref 10–40)
Albumin: 4.6 g/dL (ref 3.6–5.1)
Alkaline phosphatase (APISO): 57 U/L (ref 36–130)
BUN: 10 mg/dL (ref 7–25)
CO2: 28 mmol/L (ref 20–32)
Calcium: 9.8 mg/dL (ref 8.6–10.3)
Chloride: 103 mmol/L (ref 98–110)
Creat: 0.85 mg/dL (ref 0.60–1.35)
GFR, Est African American: 132 mL/min/{1.73_m2} (ref >=60)
GFR, Est Non African American: 114 mL/min/{1.73_m2} (ref >=60)
Globulin: 3 g/dL (calc) (ref 1.9–3.7)
Glucose, Bld: 119 mg/dL — ABNORMAL HIGH (ref 65–99)
Potassium: 4 mmol/L (ref 3.5–5.3)
Sodium: 139 mmol/L (ref 135–146)
Total Bilirubin: 0.5 mg/dL (ref 0.2–1.2)
Total Protein: 7.6 g/dL (ref 6.1–8.1)

## 2020-04-01 LAB — CBC WITH DIFFERENTIAL/PLATELET
Absolute Monocytes: 769 cells/uL (ref 200–950)
Basophils Absolute: 44 cells/uL (ref 0–200)
Basophils Relative: 0.3 %
Eosinophils Absolute: 116 cells/uL (ref 15–500)
Eosinophils Relative: 0.8 %
HCT: 46.1 % (ref 38.5–50.0)
Hemoglobin: 16.1 g/dL (ref 13.2–17.1)
Lymphs Abs: 2799 cells/uL (ref 850–3900)
MCH: 30.3 pg (ref 27.0–33.0)
MCHC: 34.9 g/dL (ref 32.0–36.0)
MCV: 86.8 fL (ref 80.0–100.0)
MPV: 10.7 fL (ref 7.5–12.5)
Monocytes Relative: 5.3 %
Neutro Abs: 10774 cells/uL — ABNORMAL HIGH (ref 1500–7800)
Neutrophils Relative %: 74.3 %
Platelets: 307 10*3/uL (ref 140–400)
RBC: 5.31 10*6/uL (ref 4.20–5.80)
RDW: 13.1 % (ref 11.0–15.0)
Total Lymphocyte: 19.3 %
WBC: 14.5 10*3/uL — ABNORMAL HIGH (ref 3.8–10.8)

## 2020-04-01 LAB — TSH: TSH: 1.7 mIU/L (ref 0.40–4.50)

## 2020-04-01 NOTE — Progress Notes (Signed)
Daxen,   Kidney and liver look great.  Thyroid great.   Your WBC is up. Do you feel bad? Have you had any prednisone lately? You could be fighting off a virus. We do need to watch that and make sure it comes down. Recheck in 2 weeks.

## 2020-04-07 ENCOUNTER — Other Ambulatory Visit: Payer: Self-pay

## 2020-04-07 DIAGNOSIS — D72829 Elevated white blood cell count, unspecified: Secondary | ICD-10-CM

## 2020-04-28 ENCOUNTER — Ambulatory Visit (INDEPENDENT_AMBULATORY_CARE_PROVIDER_SITE_OTHER): Admitting: Physician Assistant

## 2020-04-28 DIAGNOSIS — Z5329 Procedure and treatment not carried out because of patient's decision for other reasons: Secondary | ICD-10-CM

## 2020-04-28 NOTE — Progress Notes (Signed)
No show

## 2020-05-22 ENCOUNTER — Ambulatory Visit: Payer: Self-pay | Admitting: Family Medicine

## 2020-06-01 ENCOUNTER — Telehealth: Payer: Self-pay | Admitting: Physician Assistant

## 2020-06-01 DIAGNOSIS — F332 Major depressive disorder, recurrent severe without psychotic features: Secondary | ICD-10-CM

## 2020-06-01 DIAGNOSIS — F1124 Opioid dependence with opioid-induced mood disorder: Secondary | ICD-10-CM

## 2020-06-01 DIAGNOSIS — F1123 Opioid dependence with withdrawal: Secondary | ICD-10-CM

## 2020-06-01 NOTE — Telephone Encounter (Signed)
He did not.

## 2020-06-01 NOTE — Telephone Encounter (Signed)
Pended [facility] referral.  I would imagine needs follow up before PT referral so we know why.  Mckyle Solanki please advise.

## 2020-06-01 NOTE — Telephone Encounter (Signed)
Patient is overdue for a visit, did he say what he needed the physical therapy for?

## 2020-06-01 NOTE — Telephone Encounter (Signed)
Patient would like a referral for physical therapy and a therapist for himself. Please advise.

## 2020-06-01 NOTE — Telephone Encounter (Signed)
I will have to know some details about what to say in referral for PT. He will need appt.

## 2020-06-01 NOTE — Telephone Encounter (Signed)
Please schedule appt for patient. Thanks.

## 2020-06-02 ENCOUNTER — Other Ambulatory Visit: Payer: Self-pay

## 2020-06-02 ENCOUNTER — Ambulatory Visit (INDEPENDENT_AMBULATORY_CARE_PROVIDER_SITE_OTHER): Admitting: Physician Assistant

## 2020-06-02 VITALS — BP 147/76 | HR 100 | Ht 73.0 in | Wt 215.0 lb

## 2020-06-02 DIAGNOSIS — M5412 Radiculopathy, cervical region: Secondary | ICD-10-CM

## 2020-06-02 DIAGNOSIS — G894 Chronic pain syndrome: Secondary | ICD-10-CM | POA: Diagnosis not present

## 2020-06-02 DIAGNOSIS — M5137 Other intervertebral disc degeneration, lumbosacral region: Secondary | ICD-10-CM | POA: Diagnosis not present

## 2020-06-02 DIAGNOSIS — M5416 Radiculopathy, lumbar region: Secondary | ICD-10-CM

## 2020-06-02 DIAGNOSIS — M5134 Other intervertebral disc degeneration, thoracic region: Secondary | ICD-10-CM | POA: Diagnosis not present

## 2020-06-02 DIAGNOSIS — M5136 Other intervertebral disc degeneration, lumbar region: Secondary | ICD-10-CM

## 2020-06-02 DIAGNOSIS — M4722 Other spondylosis with radiculopathy, cervical region: Secondary | ICD-10-CM

## 2020-06-02 DIAGNOSIS — F1021 Alcohol dependence, in remission: Secondary | ICD-10-CM

## 2020-06-02 DIAGNOSIS — F1121 Opioid dependence, in remission: Secondary | ICD-10-CM

## 2020-06-02 DIAGNOSIS — M51379 Other intervertebral disc degeneration, lumbosacral region without mention of lumbar back pain or lower extremity pain: Secondary | ICD-10-CM

## 2020-06-02 DIAGNOSIS — M51369 Other intervertebral disc degeneration, lumbar region without mention of lumbar back pain or lower extremity pain: Secondary | ICD-10-CM

## 2020-06-02 NOTE — Progress Notes (Signed)
Subjective:    Patient ID: Cole [name], male    DOB: November 13, 1985, 35 y.o.   MRN: [id number]  HPI  Patient is a 35 year old male with chronic upper mid and low back pain and history of opioid and alcohol dependence who presents to the clinic to discuss chronic pain.  Is very interested in trying hydrotherapy PT for his back pain.  He has been 126 days sober from alcohol and opioids.  He just recently got a new job and feels like he is doing better.  He continues to have ongoing pain that he is currently treating with Flexeril and naproxen.  He has significant numbness and tingling in his hands, feet, legs, chest that are persistent but intermittently worse.  He has seen neurosurgery in the past but it has been a while.  He is interested in restarting this work-up so he might can see if there is any other interventions.  Cervical MRI 09/2017 IMPRESSION: 1. Symptomatic abnormality favored to be a leftward disc herniation at C5-C6 affecting the left foramen. Query left C6 radiculitis. 2. More chronic appearing C6-C7 disc and endplate degeneration with up to severe left greater than right C7 neural foraminal stenosis. 3. Mild cervical spine degeneration elsewhere.  Other MRI done in other health systems.   .. Active Ambulatory Problems    Diagnosis Date Noted  . ALCOHOL WITHDRAWAL 08/04/2009  . Alcohol dependence in remission (HCC) 08/25/2009  . DEPRESSIVE DISORDER NOT ELSEWHERE CLASSIFIED 11/17/2008  . MIGRAINE, CLASSICAL, INTRACTABLE 03/12/2009  . SINUS TACHYCARDIA 06/29/2009  . Other intervertebral disc degeneration, thoracic region 01/14/2009  . Other intervertebral disc degeneration, lumbar region 01/14/2009  . LOW BACK PAIN, CHRONIC 11/17/2008  . SYNCOPE 07/27/2009  . NAUSEA WITH VOMITING 03/12/2009  . ELEVATED BLOOD PRESSURE WITHOUT DIAGNOSIS OF HYPERTENSION 02/10/2009  . ADHD (attention deficit hyperactivity disorder) 10/11/2012  . Opioid dependence in remission (HCC)  10/29/2012  . Attention deficit hyperactivity disorder (ADHD), 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  . Tachycardia 12/18/2014  . Depression, recurrent (HCC) 12/26/2016  . GAD (generalized anxiety disorder) 12/26/2016  . History of allergic reaction 02/06/2017  . Angioedema 10/16/2018  . Allergic reaction/recurrent urticaria 10/16/2018  . 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 Recurrent urticaria 11/20/2018  . Chronic rhinitis 11/20/2018  . Transient amnesia 12/17/2018  . Anxiety 12/17/2018  . Acute stress reaction 12/17/2018  . Essential hypertension 03/06/2019  . Drug abuse (HCC) 03/06/2019  . Alcohol abuse 04/08/2019  . Disorientation 05/07/2019  . Noncompliance with medication treatment due to overuse of medication 05/07/2019  . History of alcohol abuse 05/07/2019  . Chronic pain syndrome 05/17/2019  . Benzodiazepine abuse (HCC) 05/17/2019  . Breast pain in male 07/01/2019  . Addiction (HCC) 09/18/2019  . AKI (acute kidney injury) (HCC) 12/04/2019  . Elevated liver enzymes 12/04/2019  . Accidental overdose 12/04/2019  . Abnormal transaminases 11/23/2019  . Cervical radiculopathy 09/14/2017  . Glenoid labrum tear 04/06/2017  . Mild episode of recurrent major depressive disorder (HCC) 12/10/2018  . 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 Tobacco use disorder 02/11/2019  . Weakness 11/24/2019  . Osteoarthritis of spine with radiculopathy, cervical region 06/02/2020  . Lumbar radiculopathy, chronic 06/03/2020   Resolved Ambulatory Problems    Diagnosis Date Noted  . NUMBNESS 12/15/2008  . Dysuria 01/06/2009  . FRACTURE, FOOT 12/09/2008  . Burn  of unspecified degree of forearm 01/26/2009  . Toe injury 07/18/2014  . Tinea pedis of both feet 07/18/2014   . Left elbow pain 01/20/2017  . Acute respiratory failure with hypoxia (HCC) 11/23/2019   Past Medical History:  Diagnosis Date  . Alcoholism (HCC)   . DDD (degenerative disc disease)   . Depression   . History of ADHD   . Hyperlipidemia   . Hypertension     Review of Systems  All other systems reviewed and are negative.      Objective:   Physical Exam Vitals reviewed.  Constitutional:      Appearance: Normal appearance.  Cardiovascular:     Rate and Rhythm: Normal rate and regular rhythm.     Pulses: Normal pulses.  Pulmonary:     Effort: Pulmonary effort is normal.     Breath sounds: Normal breath sounds.  Musculoskeletal:     Right lower leg: No edema.     Left lower leg: No edema.     Comments: Stiffness to ROM of neck. Upper and lower bilateral ext strength 5/5.  Negative SLR to reproduce pain/numbness. Pt reports continual numbness and tingling down both legs intermittently worse and in both hands.    Neurological:     General: No focal deficit present.     Mental Status: He is alert and oriented to person, place, and time.  Psychiatric:        Mood and Affect: Mood normal.           Assessment & Plan:  Marland KitchenMarland KitchenGayland was seen today for back pain.  Diagnoses and all orders for this visit:  Chronic pain syndrome -     Ambulatory referral to Physical Therapy  Cervical radiculopathy -     Ambulatory referral to Physical Therapy -     MR Cervical Spine Wo Contrast  DISC DISEASE, LUMBAR -     Ambulatory referral to Physical Therapy  Thoracic degenerative disc disease -     Ambulatory referral to Physical Therapy  Osteoarthritis of spine with radiculopathy, cervical region -     Ambulatory referral to Physical Therapy -     MR Cervical Spine Wo Contrast  Lumbar radiculopathy, chronic -     Ambulatory referral to Physical Therapy  Other intervertebral disc degeneration, lumbar region -     MR Lumbar Spine Wo Contrast  Other intervertebral  disc degeneration, thoracic region -     MR Thoracic Spine Wo Contrast  Alcohol dependence in remission (HCC)  Opioid dependence in remission (HCC)   Pt is requesting PT with hydrotherapy. Ok for referral.  He continues to c/o radiculopathy symptoms. No recent MRI. Pt agrees to imaging to see if ne needs more intervention. He has had work up for this but many years ago. He does has hx of addiction to opioids and alcohol. Much caution with any medications. Consider lyrica in future. Sober for 126 days. He just got a new job and doing well. He has BH referral.   Pt previously saw Dr. Marikay Alar who he would be interested in seeing again in the future.

## 2020-06-02 NOTE — Telephone Encounter (Signed)
Appointment has been made. NO further questions at this time.  °

## 2020-06-03 ENCOUNTER — Encounter: Payer: Self-pay | Admitting: Physician Assistant

## 2020-06-03 DIAGNOSIS — M5416 Radiculopathy, lumbar region: Secondary | ICD-10-CM | POA: Insufficient documentation

## 2020-08-04 ENCOUNTER — Ambulatory Visit (HOSPITAL_COMMUNITY): Admitting: Licensed Clinical Social Worker

## 2020-10-03 ENCOUNTER — Other Ambulatory Visit: Payer: Self-pay | Admitting: Physician Assistant

## 2020-10-03 DIAGNOSIS — M545 Low back pain, unspecified: Secondary | ICD-10-CM

## 2020-10-03 DIAGNOSIS — G8929 Other chronic pain: Secondary | ICD-10-CM

## 2020-10-13 ENCOUNTER — Other Ambulatory Visit: Payer: Self-pay | Admitting: Physician Assistant

## 2020-10-13 DIAGNOSIS — G8929 Other chronic pain: Secondary | ICD-10-CM

## 2020-10-13 DIAGNOSIS — M545 Low back pain, unspecified: Secondary | ICD-10-CM

## 2020-10-13 NOTE — Telephone Encounter (Signed)
Please review and advise.  Last filled 6 months ago

## 2020-12-01 ENCOUNTER — Other Ambulatory Visit: Payer: Self-pay | Admitting: Physician Assistant

## 2020-12-01 DIAGNOSIS — F1123 Opioid dependence with withdrawal: Secondary | ICD-10-CM

## 2020-12-01 DIAGNOSIS — F332 Major depressive disorder, recurrent severe without psychotic features: Secondary | ICD-10-CM

## 2020-12-01 DIAGNOSIS — F4325 Adjustment disorder with mixed disturbance of emotions and conduct: Secondary | ICD-10-CM

## 2020-12-03 ENCOUNTER — Other Ambulatory Visit: Payer: Self-pay | Admitting: Physician Assistant

## 2020-12-03 DIAGNOSIS — F1123 Opioid dependence with withdrawal: Secondary | ICD-10-CM

## 2020-12-03 DIAGNOSIS — F4325 Adjustment disorder with mixed disturbance of emotions and conduct: Secondary | ICD-10-CM

## 2020-12-03 DIAGNOSIS — F1124 Opioid dependence with opioid-induced mood disorder: Secondary | ICD-10-CM

## 2020-12-03 DIAGNOSIS — F332 Major depressive disorder, recurrent severe without psychotic features: Secondary | ICD-10-CM

## 2020-12-16 ENCOUNTER — Other Ambulatory Visit: Payer: Self-pay

## 2020-12-16 ENCOUNTER — Ambulatory Visit (INDEPENDENT_AMBULATORY_CARE_PROVIDER_SITE_OTHER): Admitting: Licensed Clinical Social Worker

## 2020-12-16 ENCOUNTER — Encounter (HOSPITAL_COMMUNITY): Payer: Self-pay

## 2020-12-16 DIAGNOSIS — Z630 Problems in relationship with spouse or partner: Secondary | ICD-10-CM | POA: Diagnosis not present

## 2020-12-16 DIAGNOSIS — F39 Unspecified mood [affective] disorder: Secondary | ICD-10-CM | POA: Diagnosis not present

## 2020-12-16 NOTE — Plan of Care (Signed)
Patient has agreed to plan.
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
## 2021-04-09 ENCOUNTER — Other Ambulatory Visit: Payer: Self-pay | Admitting: Physician Assistant

## 2021-04-09 DIAGNOSIS — I10 Essential (primary) hypertension: Secondary | ICD-10-CM

## 2021-04-09 DIAGNOSIS — G8929 Other chronic pain: Secondary | ICD-10-CM

## 2021-04-22 ENCOUNTER — Emergency Department (INDEPENDENT_AMBULATORY_CARE_PROVIDER_SITE_OTHER)
Admission: RE | Admit: 2021-04-22 | Discharge: 2021-04-22 | Disposition: A | Discharge status: Home / Self Care | Source: Ambulatory Visit

## 2021-04-22 ENCOUNTER — Other Ambulatory Visit: Payer: Self-pay

## 2021-04-22 VITALS — BP 146/98 | HR 93 | Temp 98.2°F | Resp 17

## 2021-04-22 DIAGNOSIS — R059 Cough, unspecified: Secondary | ICD-10-CM

## 2021-04-22 DIAGNOSIS — K122 Cellulitis and abscess of mouth: Secondary | ICD-10-CM

## 2021-04-22 MED ORDER — BENZONATATE 200 MG PO CAPS: 200.0000 mg | ORAL_CAPSULE | Freq: Three times a day (TID) | ORAL | 0 refills | Status: AC | PRN

## 2021-04-22 MED ORDER — AZITHROMYCIN 250 MG PO TABS: 250.0000 mg | ORAL_TABLET | Freq: Every day | ORAL | 0 refills | Status: DC
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
## 2021-04-22 NOTE — Discharge Instructions (Addendum)
Advised patient take medication as directed with food to completion.  Advised patient may use Tessalon Perles daily or as needed for cough.  Encouraged patient to increase daily water intake while taking these medications.

## 2021-04-22 NOTE — ED Triage Notes (Signed)
Pt c/o cough and sore throat x 3-4 days ago. No known fever. This am woke with RT ear pain. Taking nyquil and OTC cough meds prn. No known covid or flu exposure.

## 2021-04-23 ENCOUNTER — Ambulatory Visit (HOSPITAL_COMMUNITY): Admitting: Licensed Clinical Social Worker

## 2021-05-16 ENCOUNTER — Other Ambulatory Visit: Payer: Self-pay | Admitting: Physician Assistant

## 2021-05-16 DIAGNOSIS — G8929 Other chronic pain: Secondary | ICD-10-CM

## 2021-06-03 ENCOUNTER — Other Ambulatory Visit: Payer: Self-pay | Admitting: Physician Assistant

## 2021-06-03 DIAGNOSIS — M545 Low back pain, unspecified: Secondary | ICD-10-CM

## 2021-06-03 DIAGNOSIS — G8929 Other chronic pain: Secondary | ICD-10-CM

## 2021-08-10 ENCOUNTER — Other Ambulatory Visit: Payer: Self-pay | Admitting: Physician Assistant

## 2021-08-10 DIAGNOSIS — G8929 Other chronic pain: Secondary | ICD-10-CM

## 2021-12-22 ENCOUNTER — Ambulatory Visit (HOSPITAL_COMMUNITY): Admitting: Licensed Clinical Social Worker

## 2022-02-01 ENCOUNTER — Ambulatory Visit (HOSPITAL_COMMUNITY): Admitting: Licensed Clinical Social Worker

## 2022-02-01 ENCOUNTER — Encounter (HOSPITAL_COMMUNITY): Payer: Self-pay | Admitting: Licensed Clinical Social Worker

## 2023-05-02 ENCOUNTER — Ambulatory Visit

## 2023-05-02 ENCOUNTER — Other Ambulatory Visit: Payer: Self-pay

## 2023-05-02 ENCOUNTER — Ambulatory Visit
Admission: EM | Admit: 2023-05-02 | Discharge: 2023-05-02 | Disposition: A | Discharge status: Home / Self Care | Attending: Family Medicine | Admitting: Family Medicine

## 2023-05-02 DIAGNOSIS — M109 Gout, unspecified: Secondary | ICD-10-CM | POA: Diagnosis not present

## 2023-05-02 DIAGNOSIS — M79671 Pain in right foot: Secondary | ICD-10-CM | POA: Diagnosis not present

## 2023-05-02 MED ORDER — COLCHICINE 0.6 MG PO TABS: 0.6000 mg | ORAL_TABLET | Freq: Every day | ORAL | 0 refills | Status: AC | PRN

## 2023-05-02 MED ORDER — PREDNISONE 20 MG PO TABS: 40.0000 mg | ORAL_TABLET | Freq: Every day | ORAL | 0 refills | Status: AC

## 2023-05-02 NOTE — Discharge Instructions (Signed)
The xray does not show any new fracture. There is a deformity of the second metatarsal, and I can see on prior xray reports from 2023 that that has been there awhile.  Take prednisone 20 mg--2 daily for 5 days  Colchicine 0.6 mg--1 tablet daily as needed for gout pain  Ice and elevation can help also.  Please follow up with your primary care about this issue

## 2023-05-02 NOTE — ED Triage Notes (Addendum)
Pt c/o RT foot pain since Sunday. Says he was stepping down steps and stepped wrong. Worse pain yesterday. Also has hx of gout. Tylenol/ibuprofen prn.
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
